# Patient Record
Sex: Female | Born: 1940 | Race: White | Hispanic: No | Marital: Single
Health system: Midwestern US, Community
[De-identification: ages and names within clinical notes are randomized; demographics above are authoritative.]

## PROBLEM LIST (undated history)

## (undated) DIAGNOSIS — M1712 Unilateral primary osteoarthritis, left knee: Secondary | ICD-10-CM

## (undated) DIAGNOSIS — M1990 Unspecified osteoarthritis, unspecified site: Secondary | ICD-10-CM

## (undated) DIAGNOSIS — F32A Depression, unspecified: Secondary | ICD-10-CM

## (undated) DIAGNOSIS — R112 Nausea with vomiting, unspecified: Secondary | ICD-10-CM

## (undated) DIAGNOSIS — Z9889 Other specified postprocedural states: Secondary | ICD-10-CM

## (undated) DIAGNOSIS — R011 Cardiac murmur, unspecified: Secondary | ICD-10-CM

## (undated) DIAGNOSIS — F419 Anxiety disorder, unspecified: Secondary | ICD-10-CM

## (undated) DIAGNOSIS — J189 Pneumonia, unspecified organism: Secondary | ICD-10-CM

## (undated) DIAGNOSIS — I10 Essential (primary) hypertension: Secondary | ICD-10-CM

## (undated) DIAGNOSIS — I499 Cardiac arrhythmia, unspecified: Secondary | ICD-10-CM

## (undated) DIAGNOSIS — N189 Chronic kidney disease, unspecified: Secondary | ICD-10-CM

## (undated) DIAGNOSIS — E119 Type 2 diabetes mellitus without complications: Secondary | ICD-10-CM

## (undated) HISTORY — PX: OTHER SURGICAL HISTORY: SHX169

## (undated) HISTORY — PX: JOINT REPLACEMENT: SHX530

## (undated) HISTORY — PX: CHOLECYSTECTOMY: SHX55

## (undated) HISTORY — PX: BACK SURGERY: SHX140

---

## 2020-06-11 ENCOUNTER — Inpatient Hospital Stay: Admit: 2020-06-11 | Payer: MEDICARE | Primary: Orthopaedic Surgery

## 2020-06-11 ENCOUNTER — Encounter

## 2020-06-11 DIAGNOSIS — M1712 Unilateral primary osteoarthritis, left knee: Secondary | ICD-10-CM

## 2020-06-11 LAB — COMPREHENSIVE METABOLIC PANEL
ALT: 25 U/L (ref 13–56)
AST: 18 U/L (ref 10–38)
Albumin/Globulin Ratio: 1 (ref 0.8–1.7)
Albumin: 3.7 g/dL (ref 3.4–5.0)
Alkaline Phosphatase: 94 U/L (ref 45–117)
Anion Gap: 6 mmol/L (ref 3.0–18)
BUN: 18 MG/DL (ref 7.0–18)
Bun/Cre Ratio: 13 (ref 12–20)
CO2: 30 mmol/L (ref 21–32)
Calcium: 9.2 MG/DL (ref 8.5–10.1)
Chloride: 106 mmol/L (ref 100–111)
Creatinine: 1.34 MG/DL — ABNORMAL HIGH (ref 0.6–1.3)
EGFR IF NonAfrican American: 38 mL/min/{1.73_m2} — ABNORMAL LOW (ref >60)
GFR African American: 46 mL/min/{1.73_m2} — ABNORMAL LOW (ref >60)
Globulin: 3.6 g/dL (ref 2.0–4.0)
Glucose: 114 mg/dL — ABNORMAL HIGH (ref 74–99)
Potassium: 4.3 mmol/L (ref 3.5–5.5)
Sodium: 142 mmol/L (ref 136–145)
Total Bilirubin: 0.3 MG/DL (ref 0.2–1.0)
Total Protein: 7.3 g/dL (ref 6.4–8.2)

## 2020-06-11 LAB — CBC
Hematocrit: 37.2 % (ref 35.0–45.0)
Hemoglobin: 11.1 g/dL — ABNORMAL LOW (ref 12.0–16.0)
MCH: 26.3 PG (ref 24.0–34.0)
MCHC: 29.8 g/dL — ABNORMAL LOW (ref 31.0–37.0)
MCV: 88.2 FL (ref 78.0–100.0)
MPV: 10.7 FL (ref 9.2–11.8)
NRBC Absolute: 0 10*3/uL (ref 0.00–0.01)
Nucleated RBCs: 0 PER 100 WBC
Platelets: 277 10*3/uL (ref 135–420)
RBC: 4.22 M/uL (ref 4.20–5.30)
RDW: 15 % — ABNORMAL HIGH (ref 11.6–14.5)
WBC: 5.3 10*3/uL (ref 4.6–13.2)

## 2020-06-11 LAB — URINALYSIS WITH MICROSCOPIC
Bilirubin, Urine: NEGATIVE
Glucose, Ur: NEGATIVE mg/dL
Hyaline Casts, UA: 1 /lpf (ref 0–2)
Nitrite, Urine: POSITIVE — AB
Protein, UA: NEGATIVE mg/dL
RBC, UA: 1 /hpf (ref 0–5)
Specific Gravity, UA: 1.022 (ref 1.005–1.030)
Urobilinogen, UA, POCT: 1 EU/dL (ref 0.2–1.0)
WBC, UA: 61 /hpf (ref 0–5)
pH, UA: 5 (ref 5.0–8.0)

## 2020-06-11 LAB — METABOLIC PANEL, COMPREHENSIVE
A-G Ratio: 1 (ref 0.8–1.7)
ALT (SGPT): 25 U/L (ref 13–56)
AST (SGOT): 18 U/L (ref 10–38)
Albumin: 3.7 g/dL (ref 3.4–5.0)
Alk. phosphatase: 94 U/L (ref 45–117)
Anion gap: 6 mmol/L (ref 3.0–18)
BUN/Creatinine ratio: 13 (ref 12–20)
BUN: 18 MG/DL (ref 7.0–18)
Bilirubin, total: 0.3 MG/DL (ref 0.2–1.0)
CO2: 30 mmol/L (ref 21–32)
Calcium: 9.2 MG/DL (ref 8.5–10.1)
Chloride: 106 mmol/L (ref 100–111)
Creatinine: 1.34 MG/DL — ABNORMAL HIGH (ref 0.6–1.3)
GFR est AA: 46 mL/min/{1.73_m2} — ABNORMAL LOW (ref >60)
GFR est non-AA: 38 mL/min/{1.73_m2} — ABNORMAL LOW (ref >60)
Globulin: 3.6 g/dL (ref 2.0–4.0)
Glucose: 114 mg/dL — ABNORMAL HIGH (ref 74–99)
Potassium: 4.3 mmol/L (ref 3.5–5.5)
Protein, total: 7.3 g/dL (ref 6.4–8.2)
Sodium: 142 mmol/L (ref 136–145)

## 2020-06-11 LAB — URINALYSIS W/MICROSCOPIC
Bilirubin: NEGATIVE
Glucose: NEGATIVE mg/dL
Hyaline cast: 1 /lpf (ref 0–2)
Nitrites: POSITIVE — AB
Protein: NEGATIVE mg/dL
RBC: 1 /hpf (ref 0–5)
Specific gravity: 1.022 (ref 1.005–1.030)
Urobilinogen: 1 EU/dL (ref 0.2–1.0)
WBC: 61 /hpf (ref 0–5)
pH (UA): 5 (ref 5.0–8.0)

## 2020-06-11 LAB — CBC W/O DIFF
ABSOLUTE NRBC: 0 10*3/uL (ref 0.00–0.01)
HCT: 37.2 % (ref 35.0–45.0)
HGB: 11.1 g/dL — ABNORMAL LOW (ref 12.0–16.0)
MCH: 26.3 PG (ref 24.0–34.0)
MCHC: 29.8 g/dL — ABNORMAL LOW (ref 31.0–37.0)
MCV: 88.2 FL (ref 78.0–100.0)
MPV: 10.7 FL (ref 9.2–11.8)
NRBC: 0 PER 100 WBC
PLATELET: 277 10*3/uL (ref 135–420)
RBC: 4.22 M/uL (ref 4.20–5.30)
RDW: 15 % — ABNORMAL HIGH (ref 11.6–14.5)
WBC: 5.3 10*3/uL (ref 4.6–13.2)

## 2020-06-12 LAB — EKG 12-LEAD
Atrial Rate: 55 {beats}/min
P Axis: 63 degrees
P-R Interval: 248 ms
Q-T Interval: 452 ms
QRS Duration: 82 ms
QTc Calculation (Bazett): 432 ms
R Axis: 80 degrees
T Axis: 75 degrees
Ventricular Rate: 55 {beats}/min

## 2020-06-12 LAB — EKG, 12 LEAD, INITIAL
Atrial Rate: 55 {beats}/min
Calculated P Axis: 63 degrees
Calculated R Axis: 80 degrees
Calculated T Axis: 75 degrees
P-R Interval: 248 ms
Q-T Interval: 452 ms
QRS Duration: 82 ms
QTC Calculation (Bezet): 432 ms
Ventricular Rate: 55 {beats}/min

## 2020-06-13 LAB — CULTURE, MRSA

## 2020-06-13 LAB — HEMOGLOBIN A1C W/EAG
Hemoglobin A1C: 6.4 % — ABNORMAL HIGH (ref 4.2–5.6)
eAG: 137 mg/dL

## 2020-06-13 LAB — HEMOGLOBIN A1C WITH EAG
Est. average glucose: 137 mg/dL
Hemoglobin A1c: 6.4 % — ABNORMAL HIGH (ref 4.2–5.6)

## 2020-06-14 LAB — CULTURE, URINE
Colonies Counted: >100000
Colony Count: >100000

## 2020-06-19 NOTE — Interval H&P Note (Addendum)
 [facility]  Notes by Arloa Fleeting, RN at 06/19/20 1530                Author: Arloa Fleeting, RN  Service: --  Author Type: Registered Nurse       Filed: 06/19/20 0945  Date of Service: 06/19/20 1530  Status: Addendum          Editor: Arloa Fleeting, RN (Registered Nurse)          Related Notes: Original Note by Arloa Fleeting, RN (Registered Nurse) filed at 06/19/20 [phone]               PAT - SURGICAL PRE-ADMISSION INSTRUCTIONS      NAME:  Anita Sanchez                                                           TODAY'S DATE:  06/19/2020      SURGERY DATE:  07/08/2020                                  SURGERY  ARRIVAL TIME:   tbd         1.  Do NOT eat or drink anything,  including candy or gum, after MIDNIGHT  on 07-08-20 , unless you have specific instructions from your Surgeon or Anesthesia Provider to do so.   2.  No smoking 24 hours before surgery.   3.  No alcohol 24 hours prior to the day of surgery.   4.  No recreational drugs for one week prior to the day of surgery.   5.  Leave all valuables, including money/purse, at home.   6.  Remove all jewelry, nail polish, makeup (including mascara); no lotions, powders, deodorant, or perfume/cologne/after shave.   7.  Glasses/Contact lenses and Dentures may be worn to the hospital.  They will be removed prior to surgery.   8.  Call your doctor if symptoms of a cold or illness develop within 24 ours prior to surgery.   9.  AN ADULT MUST DRIVE YOU HOME AFTER OUTPATIENT SURGERY.    10.  If you are having an OUTPATIENT procedure, please make arrangements for a responsible adult to be with you for 24 hours after your surgery.   11.  If you are admitted to the hospital, you will be assigned to a bed after surgery is complete.  Normally a family member will not be able to  see you until you are in your assigned bed.   12. Visitation Restrictions Explained.      Patient does not have a DNR order.   Urine culture was positive for E. Coli. Labs faxed to Dr. Thelbert. LVM for [facility].       Special Instructions:   Covid Test :Pt is vaccinated. Vaccination record in media. Quarantine requirements discussed   Take these medications the morning of surgery with a sip of water:  flecainide, metoprolol, sertraline, HOLD metformin/glucophage dose starting the EVENING BEFORE the day of surgery.     Stop all supplements 2 weeks prior to surgery   No NSAIDs 7 days prior to surgery   Follow-up with prescriber for instructions on when to stop eliquis and aspirin.      Patient Prep:  use CHG solution, Instructions provided.       These surgical instructions were reviewed with patient during the PAT phone call. Pt verbalized understanding of instructions provided.      Preoperative Nutrition Screen (PONS)    Patient's Age: 80 y.o.      Patient's BMI: Estimated body mass index is 35.25 kg/m as calculated from the following:     Height as of this encounter: 5' 3 (1.6 m).     Weight as of this encounter: 90.3 kg (199 lb).       If the answer to any of the following is Yes, then recommend prescribe Oral Nutrition Supplements (ONS) for at least 7 days prior to surgery and/or order referral to dietitian for further assessment and nutrition therapy.         1. Does the patient have a documented serum albumin less than 3.0 within the last 90 days?    No = 0                  2. Is patient's BMI less than 18.5 (or less than 20 if age over 59)?   No = 0            3. Has the patient had an unplanned weight loss of 10% of body weight or more in the last 6 months?  No = 0     4. Has the patient been eating less than 50% of their normal diet in the preceding week?  No = 0        PONS Score (number of Yes responses), 0-4  0

## 2020-06-20 ENCOUNTER — Inpatient Hospital Stay: Payer: MEDICARE | Primary: Orthopaedic Surgery

## 2020-07-03 NOTE — H&P (Signed)
H&P  by Truett Perna, PA-C at 07/03/20 [phone]                Author: Coralee Pesa Lenox Ponds, PA-C  Service: Physician Assistant  Author Type: Physician Assistant       Filed: 07/03/20 0738  Date of Service: 07/03/20 0736  Status: Signed           Editor: Hawraa Stambaugh, Talmadge Coventry (Physician Assistant)  Cosigner: Christeen Douglas, MD at 07/08/20 [phone]                                   History and Physical            Patient: Anita Sanchez               Sex: female          DOA: (Not on file)            Date of Birth:  04-Nov-1940      Age:  80 y.o.        LOS:  LOS: 0 days            HPI:        Leva is an 80 year old, right-handed white female referred here for evaluation and treatment of a left severe medial tibiofemoral DJD. Her daughter is Lili Harts, who is a local of PT aide and sees a lot of patients postoperatively in their homes. She  is here because of her daughter's experience. She has already had cortisone injections, viscosupplementation, etc., and has continued pain that is unresponsive to these interventions.      Standing AP, tunnel, lateral, and sunrise views of the left knee were obtained and interpreted in the office and show severe medial tibiofemoral degenerative joint disease.     Past Medical History:        Diagnosis  Date         ?  Arrhythmia            afib- had an ablation in 2016         ?  Arthritis            joints, knees, hips back         ?  Chronic kidney disease            stage 3         ?  Chronic pain       ?  Diabetes (HCC)       ?  Hypercholesteremia       ?  Hypertension       ?  Psychiatric disorder            anxiety         ?  Syncope and collapse  04/2019             Past Surgical History:         Procedure  Laterality  Date          ?  HX BACK SURGERY              lumbar          ?  HX CHOLECYSTECTOMY         ?  HX COLONOSCOPY         ?  HX HEENT  Bilateral       ?  HX HYSTERECTOMY         ?  PR PARTIAL HIP REPLACEMENT  Right       ?  PR TOTAL HIP  ARTHROPLASTY  Right            x3           No family history on file.        Social History          Socioeconomic History         ?  Marital status:  SINGLE       Tobacco Use         ?  Smoking status:  Never Smoker     ?  Smokeless tobacco:  Never Used       Vaping Use         ?  Vaping Use:  Never used       Substance and Sexual Activity         ?  Alcohol use:  Not Currently         ?  Drug use:  Never             Prior to Admission medications             Medication  Sig  Start Date  End Date  Taking?  Authorizing Provider            acetaminophen (TYLENOL) 500 mg tablet  Take 500 mg by mouth every four (4) hours as needed.        Provider, Historical     apixaban (ELIQUIS) 5 mg tablet  Take 5 mg by mouth two (2) times a day.  04/15/20      Provider, Historical     aspirin delayed-release 81 mg tablet  Take 81 mg by mouth daily.        Provider, Historical     atorvastatin (LIPITOR) 40 mg tablet  Take 1 Tablet by mouth nightly.  05/18/20      Provider, Historical     cholecalciferol (VITAMIN D3) 25 mcg (1,000 unit) cap  Take 1,000 Units by mouth daily.        Provider, Historical     flecainide (TAMBOCOR) 50 mg tablet  Take 50 mg by mouth two (2) times a day.        Provider, Historical     gabapentin (NEURONTIN) 300 mg capsule  Take 300 mg by mouth nightly.        Provider, Historical     diphenhydrAMINE-acetaminophen 25-500 mg tab  Take 1 Tablet by mouth nightly as needed.        Provider, Historical     metFORMIN (GLUCOPHAGE) 500 mg tablet  Take 1 Tablet by mouth nightly.  08/22/19      Provider, Historical     metoprolol succinate (TOPROL-XL) 50 mg XL tablet  Take 50 mg by mouth daily.  05/18/20      Provider, Historical            sertraline (ZOLOFT) 50 mg tablet  Take 1 Tablet by mouth daily.  02/17/20      Provider, Historical             Allergies        Allergen  Reactions         ?  Sulfa (Sulfonamide Antibiotics)  Rash         ?  Morphine  Itching           Review of Systems   GENERAL:  Patient has no signs  of fever, chills or weight change.   HEAD/ENTM:  Patient has no signs of headaches, dizziness, hearing loss, ringing in ears, sore throat/hoarseness, recent cold, double vision, blurred vision, itchy eyes, eye redness or eye discharge.   NEUROLOGIC:  Patient has no signs of fainting, muscle weakness, numbness/tingling, loss of balance or seizure disorder.   CARDIOVASCULAR:  Patient has no signs of chest pain, palpitations, rheumatic fever or heart murmur.   RESPIRATORY:  Patient has no signs of chronic cough, wheezing, difficulty breathing, pain on breathing or shortness of breath.   GASTROINTESTINAL:  Patient has no signs of nausea/vomiting, difficulty swallowing, gas/bloating, indigestion, abdominal pain, diarrhea, bloody stools or hemorrhoids.   GENITOURINARY:  Patient has no signs of blood in urine, painful urinating, burning sensation, bladder/kidney infection, frequent urinating or incontinence.   MUSCULOSKELETAL: Patient presents with joint pain. Patient has no signs of fracture/dislocation, sprain/strain, tendonitis, joint stiffness, rheumatoid disease, gout or swelling of feet.   INTEGUMENTARY:  Patient has no signs of rash/itching, psoriasis, Raynaud's phenomenon or varicose veins.   EMOTIONAL: Patient presents with anxiety and depression. Patient has no signs of bipolar disorder, memory loss or change in mood.           Physical Exam:         There were no vitals taken for this visit.      Physical Exam:    Physical exam shows a healthy-appearing 80 year old white female. The left knee has pain on the medial joint line with positive patellofemoral crepitation and positive patellar inhibition. Otherwise, examination of the left knee demonstrates  the patient has full extension to flexion well beyond 90 degrees.  The patient has a negative Lachman and has no varus or valgus instability at zero degrees or 30 degrees.  There is a negative posterior sag.  There is no knee effusion.  There is no swelling,   ecchymosis, or wounds.  The patient has no lateral joint line pain and no popliteal pain.  The patient has a negative patellar grind and a negative patellar apprehension test.  There is a negative Barista.  There is no pain at the inferior pole  of the patella or the tibial tubercle.  The patient has 5/5 muscle strength with good quadriceps tone.  The patient has good capillary refill with normal motor strength of the foot and ankle and normal light-touch sensation in the foot and lower leg.           Assessment/Plan        Principal Problem:     Primary osteoarthritis of left knee (07/03/2020)      Active Problems:     Anxiety (07/03/2020)        A-fib (HCC) (07/03/2020)        Diabetes mellitus type 2, controlled (HCC) (07/03/2020)        HTN (hypertension) (07/03/2020)         The risks associated with left outpatient total knee arthroplasty using the subvastus approach were reviewed and all questions were answered.  The benefits include earlier ambulation, better mobility, and quicker return of muscle function.  The risks  including pain, bleeding, infection, DVT, need for future surgeries amongst others are reviewed and questions are answered.  The patient is going to be scheduled for outpatient TKA using the DePuy Attune knee replacement system.  We have given the patient  Keflex for antibiotic prophylaxis and also a prescription for Roxicodone for postoperative pain management.  We will use aspirin 81mg  twice  daily for DVT prophylaxis.  The patient will also receive an IV dose of antibiotics preoperatively.  The patient  will be discharged the same day to home health physical therapy.  The patient was counseled on the importance of ice, elevation, muscle stretching and range of motion exercises.  The patient will follow up two weeks postoperatively.  Review of x-rays  show Kellgren-Lawrence grade 3/4 changes. She does not require a walker today as she has one at home. She is on Eliquis and will discuss this  with her cardiologist about how to handle this perioperatively. She takes this with one baby aspirin twice a  day normally.

## 2020-07-03 NOTE — Care Coordination-Inpatient (Signed)
Anita Sanchez watched the joint seminar online and received a preoperative education booklet in anticipation of joint replacement surgery    Nurse Navigator

## 2020-07-08 ENCOUNTER — Inpatient Hospital Stay: Payer: MEDICARE

## 2020-08-04 ENCOUNTER — Ambulatory Visit (INDEPENDENT_AMBULATORY_CARE_PROVIDER_SITE_OTHER): Payer: Medicare Other

## 2020-08-04 ENCOUNTER — Ambulatory Visit (INDEPENDENT_AMBULATORY_CARE_PROVIDER_SITE_OTHER): Payer: Medicare Other | Admitting: Orthopaedic Surgery

## 2020-08-04 VITALS — Ht 63.0 in | Wt 199.0 lb

## 2020-08-04 DIAGNOSIS — M1712 Unilateral primary osteoarthritis, left knee: Secondary | ICD-10-CM

## 2020-08-04 DIAGNOSIS — G8929 Other chronic pain: Secondary | ICD-10-CM | POA: Diagnosis not present

## 2020-08-04 DIAGNOSIS — M25562 Pain in left knee: Secondary | ICD-10-CM

## 2020-08-04 NOTE — Progress Notes (Signed)
Office Visit Note   Patient: Katie Zuniga           Date of Birth: 1941-03-27           MRN: [id number] Visit Date: 08/04/2020              Requested by: No referring provider defined for this encounter. PCP: No primary care provider on file.   Assessment & Plan: Visit Diagnoses:  1. Chronic pain of left knee   2. Unilateral primary osteoarthritis, left knee     Plan: Given the severity of her left knee pain combined with the arthritic findings on x-ray and the failure of multiple rounds of conservative treatment, we are recommending knee replacement surgery for left knee and she is requested this as well.  I did show her knee model and went over her x-rays.  We talked about knee replacement surgery in detail.  I would have her stop her Eliquis 4 days before surgery.  I described the interoperative and postoperative course and we talked in detail about the risks and benefits of the surgery.  All questions and concerns were answered and addressed.  She is interested in having this done in the near future.  Follow-Up Instructions: Return for 2 weeks post-op.   Orders:  Orders Placed This Encounter  Procedures  . XR Knee 1-2 Views Left   No orders of the defined types were placed in this encounter.     Procedures: No procedures performed   Clinical Data: No additional findings.   Subjective: Chief Complaint  Patient presents with  . Left Knee - Pain  The patient is a 80 year old female that I am seeing for the first time.  She actually has an orthopedic surgeon in [place] who is since left town.  She comes in with a chief complaint of chronic left knee pain.  It has been hurting for many years off-and-on but her pain has been worsening with activities for many years now and is getting worse.  At this point her left knee pain is daily and it is 10 out of 10.  It is detrimentally affecting her mobility, her quality of life and actives daily living.  She cannot take  anti-inflammatories since she is on Eliquis.  This is for A. fib.  She is a diabetic but has a hemoglobin A1c of well below 7.  She does use a cane to mobilize.  She has had numerous steroid injections and hyaluronic acid injections in the left knee over the years.  She has tried quad training activities and exercises as well as modified her activities in general.  At this point she is ready to proceed with knee replacement surgery.  She has had conservative treatment for well over 12 months now with the failure of this due to her left knee pain.  It does wake her up at night.  She has slowed down significantly as a result of her left knee hurting her.  HPI  Review of Systems She currently denies any headache, chest pain, shortness of breath, fever, chills, nausea, vomiting  Objective: Vital Signs: Ht 5\' 3"  (1.6 m)   Wt 199 lb (90.3 kg)   BMI 35.25 kg/m   Physical Exam She is alert and orient x3 and in no acute distress Ortho Exam Examination of her left painful knee shows medial joint line tenderness and lateral tenderness.  There is pain throughout the flexion extension arc.  The knee is ligamentously stable but does have varus  malalignment.  She has a positive Murray sign to the medial compartment.  There is also mild effusion with the right knee. Specialty Comments:  No specialty comments available.  Imaging: XR Knee 1-2 Views Left  Result Date: 08/04/2020 2 views the left knee show tricompartment arthritic changes.  There is varus malalignment.  There are para-articular osteophytes in all 3 compartments.  There is medial joint space narrowing and calcifications around both medial and lateral meniscus.    PMFS History: Patient Active Problem List   Diagnosis Date Noted  . Unilateral primary osteoarthritis, left knee 08/04/2020   No past medical history on file.  No family history on file.   Social History   Occupational History  . Not on file  Tobacco Use  . Smoking  status: Not on file  . Smokeless tobacco: Not on file  Substance and Sexual Activity  . Alcohol use: Not on file  . Drug use: Not on file  . Sexual activity: Not on file

## 2020-08-04 NOTE — Progress Notes (Deleted)
Office Visit Note   Patient: Katie Zuniga           Date of Birth: [birth year]           MRN: [id number] Visit Date: 08/04/2020              Requested by: No referring provider defined for this encounter. PCP: No primary care provider on file.   Assessment & Plan: Visit Diagnoses:  1. Chronic pain of left knee     Plan: ***  Follow-Up Instructions: No follow-ups on file.   Orders:  Orders Placed This Encounter  Procedures  . XR Knee 1-2 Views Left   No orders of the defined types were placed in this encounter.     Procedures: No procedures performed   Clinical Data: No additional findings.   Subjective: Chief Complaint  Patient presents with  . Left Knee - Pain  HPI  Review of Systems   Objective: Vital Signs: Ht 5\' 3"  (1.6 m)   Wt 199 lb (90.3 kg)   BMI 35.25 kg/m   Physical Exam  Ortho Exam  Specialty Comments:  No specialty comments available.  Imaging: No results found.   PMFS  History: There are no problems to display for this patient.  No past medical history on file.  No family history on file.  *** The histories are not reviewed yet. Please review them in the "History" navigator section and refresh this SmartLink. Social History   Occupational History  . Not on file  Tobacco Use  . Smoking status: Not on file  . Smokeless tobacco: Not on file  Substance and Sexual Activity  . Alcohol use: Not on file  . Drug use: Not on file  . Sexual activity: Not on file

## 2020-08-25 NOTE — Progress Notes (Signed)
Need orders in epic.  Srugeryon 5/27.  preop on 5/19.

## 2020-08-26 ENCOUNTER — Other Ambulatory Visit: Payer: Self-pay | Admitting: Physician Assistant

## 2020-08-27 ENCOUNTER — Other Ambulatory Visit: Payer: Self-pay

## 2020-08-27 ENCOUNTER — Encounter (HOSPITAL_COMMUNITY)
Admission: RE | Admit: 2020-08-27 | Discharge: 2020-08-27 | Disposition: A | Discharge status: Home / Self Care | Payer: Medicare Other | Source: Ambulatory Visit | Attending: Orthopaedic Surgery | Admitting: Orthopaedic Surgery

## 2020-08-27 ENCOUNTER — Encounter (HOSPITAL_COMMUNITY): Payer: Self-pay

## 2020-08-27 DIAGNOSIS — I129 Hypertensive chronic kidney disease with stage 1 through stage 4 chronic kidney disease, or unspecified chronic kidney disease: Secondary | ICD-10-CM | POA: Insufficient documentation

## 2020-08-27 DIAGNOSIS — Z7984 Long term (current) use of oral hypoglycemic drugs: Secondary | ICD-10-CM | POA: Insufficient documentation

## 2020-08-27 DIAGNOSIS — M1712 Unilateral primary osteoarthritis, left knee: Secondary | ICD-10-CM | POA: Insufficient documentation

## 2020-08-27 DIAGNOSIS — Z79899 Other long term (current) drug therapy: Secondary | ICD-10-CM | POA: Insufficient documentation

## 2020-08-27 DIAGNOSIS — N183 Chronic kidney disease, stage 3 unspecified: Secondary | ICD-10-CM | POA: Insufficient documentation

## 2020-08-27 DIAGNOSIS — Z01812 Encounter for preprocedural laboratory examination: Secondary | ICD-10-CM | POA: Diagnosis not present

## 2020-08-27 DIAGNOSIS — Z7901 Long term (current) use of anticoagulants: Secondary | ICD-10-CM | POA: Insufficient documentation

## 2020-08-27 DIAGNOSIS — I4891 Unspecified atrial fibrillation: Secondary | ICD-10-CM | POA: Diagnosis not present

## 2020-08-27 DIAGNOSIS — E1122 Type 2 diabetes mellitus with diabetic chronic kidney disease: Secondary | ICD-10-CM | POA: Diagnosis not present

## 2020-08-27 DIAGNOSIS — Z7982 Long term (current) use of aspirin: Secondary | ICD-10-CM | POA: Insufficient documentation

## 2020-08-27 HISTORY — DX: Other specified postprocedural states: Z98.890

## 2020-08-27 HISTORY — DX: Cardiac murmur, unspecified: R01.1

## 2020-08-27 HISTORY — DX: Type 2 diabetes mellitus without complications: E11.9

## 2020-08-27 HISTORY — DX: Depression, unspecified: F32.A

## 2020-08-27 HISTORY — DX: Nausea with vomiting, unspecified: R11.2

## 2020-08-27 HISTORY — DX: Cardiac arrhythmia, unspecified: I49.9

## 2020-08-27 HISTORY — DX: Essential (primary) hypertension: I10

## 2020-08-27 HISTORY — DX: Unspecified osteoarthritis, unspecified site: M19.90

## 2020-08-27 HISTORY — DX: Anxiety disorder, unspecified: F41.9

## 2020-08-27 HISTORY — DX: Chronic kidney disease, unspecified: N18.9

## 2020-08-27 HISTORY — DX: Pneumonia, unspecified organism: J18.9

## 2020-08-27 LAB — BASIC METABOLIC PANEL
Anion gap: 11 (ref 5–15)
BUN: 16 mg/dL (ref 8–23)
CO2: 26 mmol/L (ref 22–32)
Calcium: 8.9 mg/dL (ref 8.9–10.3)
Chloride: 103 mmol/L (ref 98–111)
Creatinine, Ser: 1.09 mg/dL — ABNORMAL HIGH (ref 0.44–1.00)
GFR, Estimated: 52 mL/min — ABNORMAL LOW (ref >60)
Glucose, Bld: 104 mg/dL — ABNORMAL HIGH (ref 70–99)
Potassium: 4.4 mmol/L (ref 3.5–5.1)
Sodium: 140 mmol/L (ref 135–145)

## 2020-08-27 LAB — CBC
HCT: 35 % — ABNORMAL LOW (ref 36.0–46.0)
Hemoglobin: 10.7 g/dL — ABNORMAL LOW (ref 12.0–15.0)
MCH: 27.4 pg (ref 26.0–34.0)
MCHC: 30.6 g/dL (ref 30.0–36.0)
MCV: 89.7 fL (ref 80.0–100.0)
Platelets: 195 10*3/uL (ref 150–400)
RBC: 3.9 MIL/uL (ref 3.87–5.11)
RDW: 15.4 % (ref 11.5–15.5)
WBC: 4.7 10*3/uL (ref 4.0–10.5)
nRBC: 0 % (ref 0.0–0.2)

## 2020-08-27 LAB — GLUCOSE, CAPILLARY: Glucose-Capillary: 97 mg/dL (ref 70–99)

## 2020-08-27 LAB — HEMOGLOBIN A1C
Hgb A1c MFr Bld: 6.2 % — ABNORMAL HIGH (ref 4.8–5.6)
Mean Plasma Glucose: 131.24 mg/dL

## 2020-08-27 LAB — SURGICAL PCR SCREEN
MRSA, PCR: NEGATIVE
Staphylococcus aureus: NEGATIVE

## 2020-08-27 NOTE — Progress Notes (Addendum)
Anesthesia Review:  PCP: [facility]  DR Luetta Nutting  Cardiologist :DR Isabel Caprice  LOV 06/01/20 Care Everywhere  Clearance on chart dated 08/04/2020.  Have requested LOV note , ekg and etc from Cardiology by fax.  Confirmation received.  REquested above information.  Chest x-ray : 07/21/20 on chart  EKG : 07/04/20 on chartEcho : Stress test: Cardiac Cath :  Activity level: can do flight of stairs slowly per pt  Sleep Study/ CPAP :none  Fasting Blood Sugar :      / Checks Blood Sugar -- times a day:   Blood Thinner/ Instructions /Last Dose: ASA / Instructions/ Last Dose :  Checks glucose once daily at home per pt  Eliquis- Stop 5 days prior per pt  Pt was in hospital at [facility] 06/2020 with pneumonia.  Have requested by fax 12 lead ekg tracing Ct angio pulm result and discharge summary as well and CXR results of 07/21/2020 from this hospitalization.  - on chart  CBC done 08/27/2020 rotued to Dr Allie Bossier.  hgba1c-08/27/20- 6.2 Discharge summary 07/04/20 on chart

## 2020-08-27 NOTE — Progress Notes (Signed)
DUE TO COVID-19 ONLY ONE VISITOR IS ALLOWED TO COME WITH YOU AND STAY IN THE WAITING ROOM ONLY DURING PRE OP AND PROCEDURE DAY OF SURGERY. THE 1 VISITOR  MAY VISIT WITH YOU AFTER SURGERY IN YOUR PRIVATE ROOM DURING VISITING HOURS ONLY!  YOU NEED TO HAVE A COVID 19 TEST ON__5/24/22 _____ @_______ , THIS TEST MUST BE DONE BEFORE SURGERY,  COVID TESTING SITE [address] [place] , IT IS ON THE RIGHT GOING OUT [address] APPROXIMATELY  2 MINUTES PAST [employer] ON THE RIGHT. ONCE YOUR COVID TEST IS COMPLETED,  PLEASE BEGIN THE QUARANTINE INSTRUCTIONS AS OUTLINED IN YOUR HANDOUT.                Katie Zuniga  08/27/2020   Your procedure is scheduled on:  09/04/20  Report to [facility] Main  Entrance   Report to admitting at   0830 AM     Call this number if you have problems the morning of surgery [phone]    REMEMBER: NO  SOLID FOOD CANDY OR GUM AFTER MIDNIGHT. CLEAR LIQUIDS UNTIL 0800am       . NOTHING BY MOUTH EXCEPT CLEAR LIQUIDS UNTIL     0800am . PLEASE FINISH ENSURE DRINK PER SURGEON ORDER  WHICH NEEDS TO BE COMPLETED AT   0800am    .      CLEAR LIQUID DIET   Foods Allowed                                                                    Coffee and tea, regular and decaf                            Fruit ices (not with fruit pulp)                                      Iced Popsicles                                    Carbonated beverages, regular and diet                                    Cranberry, grape and apple juices Sports drinks like Gatorade Lightly seasoned clear broth or consume(fat free) Sugar, honey syrup ___________________________________________________________________      BRUSH YOUR TEETH MORNING OF SURGERY AND RINSE YOUR MOUTH OUT, NO CHEWING GUM CANDY OR MINTS.     Take these medicines the morning of surgery with A SIP OF WATER:    Zoloft, torpol, flecainide DO NOT TAKE ANY DIABETIC MEDICATIONS DAY OF YOUR  SURGERY                               You may not have any metal on your body including hair pins and              piercings  Do not  wear jewelry, make-up, lotions, powders or perfumes, deodorant             Do not wear nail polish on your fingernails.  Do not shave  48 hours prior to surgery.              Men may shave face and neck.   Do not bring valuables to the hospital. [place].  Contacts, dentures or bridgework may not be worn into surgery.  Leave suitcase in the car. After surgery it may be brought to your room.     Patients discharged the day of surgery will not be allowed to drive home. IF YOU ARE HAVING SURGERY AND GOING HOME THE SAME DAY, YOU MUST HAVE AN ADULT TO DRIVE YOU HOME AND BE WITH YOU FOR 24 HOURS. YOU MAY GO HOME BY TAXI OR UBER OR ORTHERWISE, BUT AN ADULT MUST ACCOMPANY YOU HOME AND STAY WITH YOU FOR 24 HOURS.  Name and phone number of your driver:  Special Instructions: N/A              Please read over the following fact sheets you were given: _____________________________________________________________________  [facility] - Preparing for Surgery Before surgery, you can play an important role.  Because skin is not sterile, your skin needs to be as free of germs as possible.  You can reduce the number of germs on your skin by washing with CHG (chlorahexidine gluconate) soap before surgery.  CHG is an antiseptic cleaner which kills germs and bonds with the skin to continue killing germs even after washing. Please DO NOT use if you have an allergy to CHG or antibacterial soaps.  If your skin becomes reddened/irritated stop using the CHG and inform your nurse when you arrive at Short Stay. Do not shave (including legs and underarms) for at least 48 hours prior to the first CHG shower.  You may shave your face/neck. Please follow these instructions carefully:  1.  Shower with CHG Soap the night before surgery and the   morning of Surgery.  2.  If you choose to wash your hair, wash your hair first as usual with your  normal  shampoo.  3.  After you shampoo, rinse your hair and body thoroughly to remove the  shampoo.                           4.  Use CHG as you would any other liquid soap.  You can apply chg directly  to the skin and wash                       Gently with a scrungie or clean washcloth.  5.  Apply the CHG Soap to your body ONLY FROM THE NECK DOWN.   Do not use on face/ open                           Wound or open sores. Avoid contact with eyes, ears mouth and genitals (private parts).                       Wash face,  Genitals (private parts) with your normal soap.             6.  Wash thoroughly, paying  special attention to the area where your surgery  will be performed.  7.  Thoroughly rinse your body with warm water from the neck down.  8.  DO NOT shower/wash with your normal soap after using and rinsing off  the CHG Soap.                9.  Pat yourself dry with a clean towel.            10.  Wear clean pajamas.            11.  Place clean sheets on your bed the night of your first shower and do not  sleep with pets. Day of Surgery : Do not apply any lotions/deodorants the morning of surgery.  Please wear clean clothes to the hospital/surgery center.  FAILURE TO FOLLOW THESE INSTRUCTIONS MAY RESULT IN THE CANCELLATION OF YOUR SURGERY PATIENT SIGNATURE_________________________________  NURSE SIGNATURE__________________________________  ________________________________________________________________________

## 2020-08-31 ENCOUNTER — Other Ambulatory Visit: Payer: Self-pay

## 2020-08-31 NOTE — Progress Notes (Signed)
Anesthesia Chart Review:   Case: [id number] Date/Time: 09/04/20 1045   Procedure: LEFT TOTAL KNEE ARTHROPLASTY (Left Knee)   Anesthesia type: Spinal   Pre-op diagnosis: osteoarthritis left knee   Location: [facility] ROOM 10 / [facility]   Surgeons: [name], MD      DISCUSSION: Pt is 80 years old with hx atrial fibrillation (s/p ablation 2013 at [facility]), HTN, DM, CKD (stage 3).   Hospitalized 3/26-31/22  for acute hypoxemic respiratory failure due to LLL pneumonia  Pt reports she is stopping eliquis 5 days before surgery  VS: BP 133/63   Pulse (!) 58   Temp 36.8 C (Oral)   Resp 16   Ht 5\' 3"  (1.6 m)   Wt 88.9 kg   SpO2 99%   BMI 34.72 kg/m    PROVIDERS: - PCP is , MD at [facility]. Last office visit 08/24/20 (notes in care everywhere)  - Cardiologist is Jimmarck Cuenta, DO. Last office visit 06/01/20 (notes in care everywhere) . Dr. 06/03/20 cleared pt for surgery    LABS: Labs reviewed: Acceptable for surgery. (all labs ordered are listed, but only abnormal results are displayed)  Labs Reviewed  CBC - Abnormal; Notable for the following components:      Result Value   Hemoglobin 10.7 (*)    HCT 35.0 (*)    All other components within normal limits  BASIC METABOLIC PANEL - Abnormal; Notable for the following components:   Glucose, Bld 104 (*)    Creatinine, Ser 1.09 (*)    GFR, Estimated 52 (*)    All other components within normal limits  HEMOGLOBIN A1C - Abnormal; Notable for the following components:   Hgb A1c MFr Bld 6.2 (*)    All other components within normal limits  SURGICAL PCR SCREEN  GLUCOSE, CAPILLARY  TYPE AND SCREEN     IMAGES: CXR 07/21/20 (care everywhere): No active disease.    CT angio pulmonary 07/04/20 (care everywhere):   There are small pleural effusions with adjacent bibasilar atelectasis. Infiltrate is less likely.   No evidence of pulmonary embolism. Mildly limited evaluation of the  subsegmental arteries at the left posterior lung base due to the left basilar atelectasis.   There are some small lymph nodes in the AP window and precarinal region of the mediastinum. Although not significantly enlarged, they are slightly larger than they were previously.    EKG 07/04/20: - report in care everywhere states: Sinus rhythm with 1st degree AV block - tracing requested.    CV: Carotid duplex 04/17/19 (care everywhere):  1. No hemodynamically significant stenosis on either side.   2. Both vertebral arteries are patent with antegrade flow.  3. 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     Echo 04/16/19 (care everywhere):  - Left Ventricle: Systolic function is normal. EF: 60-65%. No regional  wall motion abnormalities noted. Unable to assess diastolic function due  to atrial fibrillation.  . Aortic Valve: Normal tricuspid aortic valve. The leaflets are mildly  calcified.  . Mitral Valve: There is trace regurgitation.  . Tricuspid Valve: Mild tricuspid valve regurgitation.  . Pulmonic Valve: Trace regurgitation.  . Pericardium: There is no pericardial effusion.   Past Medical History:  Diagnosis Date  . Anxiety   . Arthritis   . Chronic kidney disease    stage 3   . Depression   . Diabetes mellitus without complication (HCC)    type 2   . Dysrhythmia  afib  . Heart murmur   . Hypertension   . Pneumonia    hx of 06/2020 in hospital in [place]   . PONV (postoperative nausea and vomiting)    hx of years ago     MEDICATIONS: . acetaminophen (TYLENOL) 500 MG tablet  . aspirin 81 MG EC tablet  . atorvastatin (LIPITOR) 40 MG tablet  . Cholecalciferol 25 MCG (1000 UT) tablet  . ELIQUIS 5 MG TABS tablet  . flecainide (TAMBOCOR) 50 MG tablet  . gabapentin (NEURONTIN) 300 MG capsule  . hydroxypropyl methylcellulose / hypromellose (ISOPTO TEARS / GONIOVISC) 2.5 % ophthalmic solution  . Melatonin 10 MG TABS  . metFORMIN (GLUCOPHAGE) 500  MG tablet  . metoprolol succinate (TOPROL-XL) 50 MG 24 hr tablet  . sertraline (ZOLOFT) 100 MG tablet   No current facility-administered medications for this encounter.   Pt reports she is stopping eliquis 5 days before surgery   If no changes, I anticipate pt can proceed with surgery as scheduled.   Rica Mast, PhD, FNP-BC [facility]/Anesthesiology Phone: [phone] 08/31/2020 1:54 PM

## 2020-08-31 NOTE — Anesthesia Preprocedure Evaluation (Addendum)
Anesthesia Evaluation  Patient identified by MRN, date of birth, ID band Patient awake    Reviewed: Allergy & Precautions, NPO status , Patient's Chart, lab work & pertinent test results  History of Anesthesia Complications (+) PONV and history of anesthetic complications  Airway Mallampati: II  TM Distance: >3 FB Neck ROM: Full    Dental  (+) Edentulous Upper, Missing,    Pulmonary neg pulmonary ROS,    Pulmonary exam normal        Cardiovascular hypertension, Normal cardiovascular exam+ dysrhythmias Atrial Fibrillation   TTE 04/2019: normal EF, mild TR   Neuro/Psych Anxiety Depression negative neurological ROS     GI/Hepatic negative GI ROS, Neg liver ROS,   Endo/Other  diabetes, Type 2  Renal/GU Renal InsufficiencyRenal disease  negative genitourinary   Musculoskeletal  (+) Arthritis ,   Abdominal   Peds  Hematology negative hematology ROS (+)   Anesthesia Other Findings Day of surgery medications reviewed with patient.  Reproductive/Obstetrics negative OB ROS                           Anesthesia Physical Anesthesia Plan  ASA: III  Anesthesia Plan: Spinal   Post-op Pain Management:  Regional for Post-op pain   Induction:   PONV Risk Score and Plan: 4 or greater and Treatment may vary due to age or medical condition, Ondansetron, Propofol infusion and Dexamethasone  Airway Management Planned: Natural Airway and Simple Face Mask  Additional Equipment: None  Intra-op Plan:   Post-operative Plan:   Informed Consent: I have reviewed the patients History and Physical, chart, labs and discussed the procedure including the risks, benefits and alternatives for the proposed anesthesia with the patient or authorized representative who has indicated his/her understanding and acceptance.       Plan Discussed with: CRNA  Anesthesia Plan Comments: (See APP note by Joslyn Hy, FNP )       Anesthesia Quick Evaluation

## 2020-09-01 ENCOUNTER — Other Ambulatory Visit (HOSPITAL_COMMUNITY)
Admission: RE | Admit: 2020-09-01 | Discharge: 2020-09-01 | Disposition: A | Discharge status: Home / Self Care | Payer: Medicare Other | Source: Ambulatory Visit | Attending: Orthopaedic Surgery | Admitting: Orthopaedic Surgery

## 2020-09-01 DIAGNOSIS — Z20822 Contact with and (suspected) exposure to covid-19: Secondary | ICD-10-CM | POA: Insufficient documentation

## 2020-09-01 DIAGNOSIS — Z01812 Encounter for preprocedural laboratory examination: Secondary | ICD-10-CM | POA: Diagnosis present

## 2020-09-01 LAB — SARS CORONAVIRUS 2 (TAT 6-24 HRS): SARS Coronavirus 2: NEGATIVE

## 2020-09-01 NOTE — Progress Notes (Signed)
Have rqequested by fax x 2 to Cardiology office and requested LOV note, ekg, stress test aND echo if available.  Called office and spoke with Medical Records.  They are to fax.

## 2020-09-03 NOTE — H&P (Signed)
TOTAL KNEE ADMISSION H&P  Patient is being admitted for left total knee arthroplasty.  Subjective:  Chief Complaint:left knee pain.  HPI: Katie Zuniga, 80 y.o. female, has a history of pain and functional disability in the left knee due to arthritis and has failed non-surgical conservative treatments for greater than 12 weeks to includeNSAID's and/or analgesics, corticosteriod injections, viscosupplementation injections, flexibility and strengthening excercises, use of assistive devices and activity modification.  Onset of symptoms was gradual, starting 2 years ago with gradually worsening course since that time. The patient noted no past surgery on the left knee(s).  Patient currently rates pain in the left knee(s) at 10 out of 10 with activity. Patient has night pain, worsening of pain with activity and weight bearing, pain that interferes with activities of daily living, pain with passive range of motion, crepitus and joint swelling.  Patient has evidence of subchondral sclerosis, periarticular osteophytes and joint space narrowing by imaging studies. There is no active infection.  Patient Active Problem List   Diagnosis Date Noted  . Unilateral primary osteoarthritis, left knee 08/04/2020   Past Medical History:  Diagnosis Date  . Anxiety   . Arthritis   . Chronic kidney disease    stage 3   . Depression   . Diabetes mellitus without complication (HCC)    type 2   . Dysrhythmia    afib  . Heart murmur   . Hypertension   . Pneumonia    hx of 06/2020 in hospital in [place]   . PONV (postoperative nausea and vomiting)    hx of years ago       No current facility-administered medications for this encounter.   Current Outpatient Medications  Medication Sig Dispense Refill Last Dose  . acetaminophen (TYLENOL) 500 MG tablet Take 500 mg by mouth every 6 (six) hours as needed.     Marland Kitchen aspirin 81 MG EC tablet Take 81 mg by mouth daily.     Marland Kitchen atorvastatin (LIPITOR) 40 MG tablet Take 40  mg by mouth daily.     . Cholecalciferol 25 MCG (1000 UT) tablet Take 1,000 Units by mouth daily.     Marland Kitchen ELIQUIS 5 MG TABS tablet Take 5 mg by mouth 2 (two) times daily.     . flecainide (TAMBOCOR) 50 MG tablet Take 50 mg by mouth 2 (two) times daily.     Marland Kitchen gabapentin (NEURONTIN) 300 MG capsule Take 300 mg by mouth at bedtime.     . hydroxypropyl methylcellulose / hypromellose (ISOPTO TEARS / GONIOVISC) 2.5 % ophthalmic solution Place 2 drops into both eyes daily.     . Melatonin 10 MG TABS Take 10 mg by mouth at bedtime.     . metFORMIN (GLUCOPHAGE) 500 MG tablet Take 500 mg by mouth every evening.     . metoprolol succinate (TOPROL-XL) 50 MG 24 hr tablet Take 100 mg by mouth daily.     . sertraline (ZOLOFT) 100 MG tablet Take 100 mg by mouth daily.      Allergies  Allergen Reactions  . Morphine And Related Itching and Rash  . Sulfa Antibiotics Rash  . Tramadol Other (See Comments)    "kept me awake" "keeps awake at night"     Social History   Tobacco Use  . Smoking status: Never Smoker  . Smokeless tobacco: Not on file  Substance Use Topics  . Alcohol use: Never    No family history on file.   Review of Systems  Musculoskeletal: Positive for  joint swelling.  All other systems reviewed and are negative.   Objective:  Physical Exam Vitals reviewed.  Constitutional:      Appearance: Normal appearance.  HENT:     Head: Normocephalic and atraumatic.  Eyes:     Pupils: Pupils are equal, round, and reactive to light.  Cardiovascular:     Rate and Rhythm: Normal rate. Rhythm irregular.     Pulses: Normal pulses.  Pulmonary:     Effort: Pulmonary effort is normal.     Breath sounds: Normal breath sounds.  Abdominal:     Palpations: Abdomen is soft.  Musculoskeletal:     Cervical back: Normal range of motion and neck supple.     Left knee: Effusion, bony tenderness and crepitus present. Decreased range of motion. Tenderness present over the medial joint line, lateral  joint line and patellar tendon. Abnormal alignment.  Neurological:     Mental Status: She is alert and oriented to person, place, and time.  Psychiatric:        Behavior: Behavior normal.     Vital signs in last 24 hours:    Labs:   Estimated body mass index is 34.72 kg/m as calculated from the following:   Height as of 08/27/20: 5\' 3"  (1.6 m).   Weight as of 08/27/20: 88.9 kg.   Imaging Review Plain radiographs demonstrate severe degenerative joint disease of the left knee(s). The overall alignment ismild varus. The bone quality appears to be fair for age and reported activity level.      Assessment/Plan:  End stage arthritis, left knee   The patient history, physical examination, clinical judgment of the provider and imaging studies are consistent with end stage degenerative joint disease of the left knee(s) and total knee arthroplasty is deemed medically necessary. The treatment options including medical management, injection therapy arthroscopy and arthroplasty were discussed at length. The risks and benefits of total knee arthroplasty were presented and reviewed. The risks due to aseptic loosening, infection, stiffness, patella tracking problems, thromboembolic complications and other imponderables were discussed. The patient acknowledged the explanation, agreed to proceed with the plan and consent was signed. Patient is being admitted for inpatient treatment for surgery, pain control, PT, OT, prophylactic antibiotics, VTE prophylaxis, progressive ambulation and ADL's and discharge planning. The patient is planning to be discharged home with home health services

## 2020-09-04 ENCOUNTER — Ambulatory Visit (HOSPITAL_COMMUNITY): Payer: Medicare Other | Admitting: Anesthesiology

## 2020-09-04 ENCOUNTER — Other Ambulatory Visit: Payer: Self-pay

## 2020-09-04 ENCOUNTER — Observation Stay (HOSPITAL_COMMUNITY)
Admission: RE | Admit: 2020-09-04 | Discharge: 2020-09-05 | Disposition: A | Discharge status: Home / Self Care | Payer: Medicare Other | Attending: Orthopaedic Surgery | Admitting: Orthopaedic Surgery

## 2020-09-04 ENCOUNTER — Observation Stay (HOSPITAL_COMMUNITY): Payer: Medicare Other

## 2020-09-04 ENCOUNTER — Encounter (HOSPITAL_COMMUNITY): Payer: Self-pay | Admitting: Orthopaedic Surgery

## 2020-09-04 ENCOUNTER — Ambulatory Visit (HOSPITAL_COMMUNITY): Payer: Medicare Other | Admitting: Emergency Medicine

## 2020-09-04 ENCOUNTER — Encounter (HOSPITAL_COMMUNITY)
Admission: RE | Disposition: A | Discharge status: Home / Self Care | Payer: Self-pay | Source: Home / Self Care | Attending: Orthopaedic Surgery

## 2020-09-04 DIAGNOSIS — Z7901 Long term (current) use of anticoagulants: Secondary | ICD-10-CM | POA: Diagnosis not present

## 2020-09-04 DIAGNOSIS — Z7984 Long term (current) use of oral hypoglycemic drugs: Secondary | ICD-10-CM | POA: Insufficient documentation

## 2020-09-04 DIAGNOSIS — I129 Hypertensive chronic kidney disease with stage 1 through stage 4 chronic kidney disease, or unspecified chronic kidney disease: Secondary | ICD-10-CM | POA: Diagnosis not present

## 2020-09-04 DIAGNOSIS — M1712 Unilateral primary osteoarthritis, left knee: Secondary | ICD-10-CM | POA: Diagnosis present

## 2020-09-04 DIAGNOSIS — N183 Chronic kidney disease, stage 3 unspecified: Secondary | ICD-10-CM | POA: Diagnosis not present

## 2020-09-04 DIAGNOSIS — E1122 Type 2 diabetes mellitus with diabetic chronic kidney disease: Secondary | ICD-10-CM | POA: Insufficient documentation

## 2020-09-04 DIAGNOSIS — Z79899 Other long term (current) drug therapy: Secondary | ICD-10-CM | POA: Insufficient documentation

## 2020-09-04 DIAGNOSIS — Z7982 Long term (current) use of aspirin: Secondary | ICD-10-CM | POA: Diagnosis not present

## 2020-09-04 DIAGNOSIS — Z96652 Presence of left artificial knee joint: Secondary | ICD-10-CM

## 2020-09-04 HISTORY — PX: TOTAL KNEE ARTHROPLASTY: SHX125

## 2020-09-04 LAB — CBC WITH DIFFERENTIAL/PLATELET
Abs Immature Granulocytes: 0.08 10*3/uL — ABNORMAL HIGH (ref 0.00–0.07)
Basophils Absolute: 0 10*3/uL (ref 0.0–0.1)
Basophils Relative: 0 %
Eosinophils Absolute: 0 10*3/uL (ref 0.0–0.5)
Eosinophils Relative: 0 %
HCT: 31.3 % — ABNORMAL LOW (ref 36.0–46.0)
Hemoglobin: 9.6 g/dL — ABNORMAL LOW (ref 12.0–15.0)
Immature Granulocytes: 1 %
Lymphocytes Relative: 9 %
Lymphs Abs: 0.7 10*3/uL (ref 0.7–4.0)
MCH: 27.5 pg (ref 26.0–34.0)
MCHC: 30.7 g/dL (ref 30.0–36.0)
MCV: 89.7 fL (ref 80.0–100.0)
Monocytes Absolute: 0.3 10*3/uL (ref 0.1–1.0)
Monocytes Relative: 4 %
Neutro Abs: 6.1 10*3/uL (ref 1.7–7.7)
Neutrophils Relative %: 86 %
Platelets: 181 10*3/uL (ref 150–400)
RBC: 3.49 MIL/uL — ABNORMAL LOW (ref 3.87–5.11)
RDW: 15.8 % — ABNORMAL HIGH (ref 11.5–15.5)
WBC: 7.1 10*3/uL (ref 4.0–10.5)
nRBC: 0 % (ref 0.0–0.2)

## 2020-09-04 LAB — TYPE AND SCREEN
ABO/RH(D): O POS
Antibody Screen: NEGATIVE

## 2020-09-04 LAB — ABO/RH: ABO/RH(D): O POS

## 2020-09-04 LAB — GLUCOSE, CAPILLARY
Glucose-Capillary: 110 mg/dL — ABNORMAL HIGH (ref 70–99)
Glucose-Capillary: 149 mg/dL — ABNORMAL HIGH (ref 70–99)

## 2020-09-04 SURGERY — ARTHROPLASTY, KNEE, TOTAL
Anesthesia: Spinal | Site: Knee | Laterality: Left

## 2020-09-04 MED ORDER — OXYCODONE HCL 5 MG PO TABS: 10.0000 mg | ORAL_TABLET | ORAL | Status: DC | PRN

## 2020-09-04 MED ORDER — PHENYLEPHRINE HCL (PRESSORS) 10 MG/ML IV SOLN
INTRAVENOUS | Status: DC | PRN
  Administered 2020-09-04: 40 ug via INTRAVENOUS
  Administered 2020-09-04: 80 ug via INTRAVENOUS

## 2020-09-04 MED ORDER — MENTHOL 3 MG MT LOZG: 1.0000 | LOZENGE | OROMUCOSAL | Status: DC | PRN

## 2020-09-04 MED ORDER — METOPROLOL SUCCINATE ER 50 MG PO TB24
100.0000 mg | ORAL_TABLET | Freq: Every day | ORAL | Status: DC
  Administered 2020-09-05: 100 mg via ORAL
  Filled 2020-09-04: qty 2

## 2020-09-04 MED ORDER — ATORVASTATIN CALCIUM 40 MG PO TABS
40.0000 mg | ORAL_TABLET | Freq: Every day | ORAL | Status: DC
  Administered 2020-09-04 – 2020-09-05 (×2): 40 mg via ORAL
  Filled 2020-09-04 (×2): qty 1

## 2020-09-04 MED ORDER — METHOCARBAMOL 500 MG IVPB - SIMPLE MED
INTRAVENOUS | Status: AC
  Administered 2020-09-04: 500 mg via INTRAVENOUS
  Filled 2020-09-04: qty 50

## 2020-09-04 MED ORDER — OXYCODONE HCL 5 MG PO TABS: 5.0000 mg | ORAL_TABLET | Freq: Once | ORAL | Status: DC | PRN

## 2020-09-04 MED ORDER — ONDANSETRON HCL 4 MG/2ML IJ SOLN: 4.0000 mg | Freq: Four times a day (QID) | INTRAMUSCULAR | Status: DC | PRN

## 2020-09-04 MED ORDER — EPHEDRINE SULFATE 50 MG/ML IJ SOLN
INTRAMUSCULAR | Status: DC | PRN
  Administered 2020-09-04: 15 mg via INTRAVENOUS
  Administered 2020-09-04: 10 mg via INTRAVENOUS
  Administered 2020-09-04 (×2): 15 mg via INTRAVENOUS
  Administered 2020-09-04: 10 mg via INTRAVENOUS
  Administered 2020-09-04: 5 mg via INTRAVENOUS
  Administered 2020-09-04: 15 mg via INTRAVENOUS

## 2020-09-04 MED ORDER — FLECAINIDE ACETATE 50 MG PO TABS
50.0000 mg | ORAL_TABLET | Freq: Two times a day (BID) | ORAL | Status: DC
  Administered 2020-09-04 – 2020-09-05 (×2): 50 mg via ORAL
  Filled 2020-09-04 (×2): qty 1

## 2020-09-04 MED ORDER — ACETAMINOPHEN 500 MG PO TABS
ORAL_TABLET | ORAL | Status: AC
  Administered 2020-09-04: 1000 mg via ORAL
  Filled 2020-09-04: qty 2

## 2020-09-04 MED ORDER — PROPOFOL 500 MG/50ML IV EMUL
INTRAVENOUS | Status: DC | PRN
  Administered 2020-09-04: 150 mg via INTRAVENOUS
  Administered 2020-09-04: 50 mg via INTRAVENOUS

## 2020-09-04 MED ORDER — ALUM & MAG HYDROXIDE-SIMETH 200-200-20 MG/5ML PO SUSP: 30.0000 mL | ORAL | Status: DC | PRN

## 2020-09-04 MED ORDER — FENTANYL CITRATE (PF) 100 MCG/2ML IJ SOLN
INTRAMUSCULAR | Status: AC
  Administered 2020-09-04: 25 ug
  Filled 2020-09-04: qty 2

## 2020-09-04 MED ORDER — APIXABAN 5 MG PO TABS
5.0000 mg | ORAL_TABLET | Freq: Two times a day (BID) | ORAL | Status: DC
  Administered 2020-09-05: 5 mg via ORAL
  Filled 2020-09-04: qty 1

## 2020-09-04 MED ORDER — MIDAZOLAM HCL 2 MG/2ML IJ SOLN
1.0000 mg | INTRAMUSCULAR | Status: DC
  Filled 2020-09-04: qty 2

## 2020-09-04 MED ORDER — ACETAMINOPHEN 325 MG PO TABS
325.0000 mg | ORAL_TABLET | Freq: Four times a day (QID) | ORAL | Status: DC | PRN
  Administered 2020-09-05: 650 mg via ORAL
  Filled 2020-09-04: qty 2

## 2020-09-04 MED ORDER — ACETAMINOPHEN 500 MG PO TABS: 1000.0000 mg | ORAL_TABLET | Freq: Once | ORAL | Status: DC

## 2020-09-04 MED ORDER — EPHEDRINE 5 MG/ML INJ
INTRAVENOUS | Status: AC
  Filled 2020-09-04: qty 10

## 2020-09-04 MED ORDER — POLYVINYL ALCOHOL 1.4 % OP SOLN
2.0000 [drp] | Freq: Two times a day (BID) | OPHTHALMIC | Status: DC
  Administered 2020-09-04: 2 [drp] via OPHTHALMIC
  Filled 2020-09-04: qty 15

## 2020-09-04 MED ORDER — FENTANYL CITRATE (PF) 100 MCG/2ML IJ SOLN
50.0000 ug | INTRAMUSCULAR | Status: DC
  Administered 2020-09-04: 50 ug via INTRAVENOUS
  Filled 2020-09-04: qty 2

## 2020-09-04 MED ORDER — METHOCARBAMOL 500 MG PO TABS
500.0000 mg | ORAL_TABLET | Freq: Four times a day (QID) | ORAL | Status: DC | PRN
  Administered 2020-09-04 – 2020-09-05 (×2): 500 mg via ORAL
  Filled 2020-09-04 (×2): qty 1

## 2020-09-04 MED ORDER — PHENYLEPHRINE 40 MCG/ML (10ML) SYRINGE FOR IV PUSH (FOR BLOOD PRESSURE SUPPORT)
PREFILLED_SYRINGE | INTRAVENOUS | Status: AC
  Filled 2020-09-04: qty 10

## 2020-09-04 MED ORDER — CLONIDINE HCL (ANALGESIA) 100 MCG/ML EP SOLN
EPIDURAL | Status: DC | PRN
  Administered 2020-09-04: 100 ug

## 2020-09-04 MED ORDER — PANTOPRAZOLE SODIUM 40 MG PO TBEC
40.0000 mg | DELAYED_RELEASE_TABLET | Freq: Every day | ORAL | Status: DC
  Administered 2020-09-04 – 2020-09-05 (×2): 40 mg via ORAL
  Filled 2020-09-04 (×2): qty 1

## 2020-09-04 MED ORDER — FENTANYL CITRATE (PF) 100 MCG/2ML IJ SOLN
INTRAMUSCULAR | Status: AC
  Filled 2020-09-04: qty 2

## 2020-09-04 MED ORDER — HYDROMORPHONE HCL 1 MG/ML IJ SOLN
0.5000 mg | INTRAMUSCULAR | Status: DC | PRN
Start: 2020-09-04 — End: 2020-09-05

## 2020-09-04 MED ORDER — LACTATED RINGERS IV SOLN: INTRAVENOUS | Status: DC

## 2020-09-04 MED ORDER — TRANEXAMIC ACID-NACL 1000-0.7 MG/100ML-% IV SOLN
1000.0000 mg | INTRAVENOUS | Status: AC
  Administered 2020-09-04: 1000 mg via INTRAVENOUS
  Filled 2020-09-04: qty 100

## 2020-09-04 MED ORDER — FENTANYL CITRATE (PF) 100 MCG/2ML IJ SOLN
25.0000 ug | INTRAMUSCULAR | Status: DC | PRN
  Administered 2020-09-04 (×2): 25 ug via INTRAVENOUS

## 2020-09-04 MED ORDER — LIDOCAINE 2% (20 MG/ML) 5 ML SYRINGE
INTRAMUSCULAR | Status: DC | PRN
  Administered 2020-09-04: 100 mg via INTRAVENOUS

## 2020-09-04 MED ORDER — PROMETHAZINE HCL 25 MG/ML IJ SOLN: 6.2500 mg | INTRAMUSCULAR | Status: DC | PRN

## 2020-09-04 MED ORDER — ASPIRIN 81 MG PO TBEC: 81.0000 mg | DELAYED_RELEASE_TABLET | Freq: Every day | ORAL | Status: DC

## 2020-09-04 MED ORDER — OXYCODONE HCL 5 MG/5ML PO SOLN: 5.0000 mg | Freq: Once | ORAL | Status: DC | PRN

## 2020-09-04 MED ORDER — METHOCARBAMOL 500 MG IVPB - SIMPLE MED
500.0000 mg | Freq: Four times a day (QID) | INTRAVENOUS | Status: DC | PRN
  Filled 2020-09-04: qty 50

## 2020-09-04 MED ORDER — 0.9 % SODIUM CHLORIDE (POUR BTL) OPTIME
TOPICAL | Status: DC | PRN
  Administered 2020-09-04: 1000 mL

## 2020-09-04 MED ORDER — VITAMIN D 25 MCG (1000 UNIT) PO TABS
1000.0000 [IU] | ORAL_TABLET | Freq: Every day | ORAL | Status: DC
  Administered 2020-09-04 – 2020-09-05 (×2): 1000 [IU] via ORAL
  Filled 2020-09-04 (×2): qty 1

## 2020-09-04 MED ORDER — CEFAZOLIN SODIUM-DEXTROSE 2-4 GM/100ML-% IV SOLN
2.0000 g | INTRAVENOUS | Status: AC
  Administered 2020-09-04: 2 g via INTRAVENOUS
  Filled 2020-09-04: qty 100

## 2020-09-04 MED ORDER — PHENOL 1.4 % MT LIQD: 1.0000 | OROMUCOSAL | Status: DC | PRN

## 2020-09-04 MED ORDER — ASPIRIN EC 81 MG PO TBEC
81.0000 mg | DELAYED_RELEASE_TABLET | Freq: Every day | ORAL | Status: DC
  Administered 2020-09-05: 81 mg via ORAL
  Filled 2020-09-04: qty 1

## 2020-09-04 MED ORDER — ONDANSETRON HCL 4 MG/2ML IJ SOLN
INTRAMUSCULAR | Status: DC | PRN
  Administered 2020-09-04: 4 mg via INTRAVENOUS

## 2020-09-04 MED ORDER — CEFAZOLIN SODIUM-DEXTROSE 1-4 GM/50ML-% IV SOLN
1.0000 g | Freq: Four times a day (QID) | INTRAVENOUS | Status: AC
  Administered 2020-09-04 (×2): 1 g via INTRAVENOUS
  Filled 2020-09-04 (×2): qty 50

## 2020-09-04 MED ORDER — GABAPENTIN 300 MG PO CAPS
300.0000 mg | ORAL_CAPSULE | Freq: Every day | ORAL | Status: DC
  Administered 2020-09-04: 300 mg via ORAL
  Filled 2020-09-04: qty 1

## 2020-09-04 MED ORDER — ACETAMINOPHEN 500 MG PO TABS: 1000.0000 mg | ORAL_TABLET | Freq: Once | ORAL | Status: AC

## 2020-09-04 MED ORDER — METOCLOPRAMIDE HCL 5 MG PO TABS
5.0000 mg | ORAL_TABLET | Freq: Three times a day (TID) | ORAL | Status: DC | PRN
Start: 2020-09-04 — End: 2020-09-05

## 2020-09-04 MED ORDER — ONDANSETRON HCL 4 MG PO TABS: 4.0000 mg | ORAL_TABLET | Freq: Four times a day (QID) | ORAL | Status: DC | PRN

## 2020-09-04 MED ORDER — FENTANYL CITRATE (PF) 100 MCG/2ML IJ SOLN
INTRAMUSCULAR | Status: DC | PRN
  Administered 2020-09-04: 50 ug via INTRAVENOUS

## 2020-09-04 MED ORDER — HYPROMELLOSE (GONIOSCOPIC) 2.5 % OP SOLN
2.0000 [drp] | Freq: Every day | OPHTHALMIC | Status: DC
  Filled 2020-09-04: qty 15

## 2020-09-04 MED ORDER — METFORMIN HCL 500 MG PO TABS
500.0000 mg | ORAL_TABLET | Freq: Every day | ORAL | Status: DC
  Administered 2020-09-04: 500 mg via ORAL
  Filled 2020-09-04: qty 1

## 2020-09-04 MED ORDER — DIPHENHYDRAMINE HCL 12.5 MG/5ML PO ELIX: 12.5000 mg | ORAL_SOLUTION | ORAL | Status: DC | PRN

## 2020-09-04 MED ORDER — OXYCODONE HCL 5 MG PO TABS: 5.0000 mg | ORAL_TABLET | Freq: Four times a day (QID) | ORAL | 0 refills | Status: DC | PRN

## 2020-09-04 MED ORDER — METOCLOPRAMIDE HCL 5 MG/ML IJ SOLN: 5.0000 mg | Freq: Three times a day (TID) | INTRAMUSCULAR | Status: DC | PRN

## 2020-09-04 MED ORDER — CHOLECALCIFEROL 25 MCG (1000 UT) PO TABS: 1000.0000 [IU] | ORAL_TABLET | Freq: Every day | ORAL | Status: DC

## 2020-09-04 MED ORDER — SODIUM CHLORIDE 0.9 % IV SOLN: INTRAVENOUS | Status: DC

## 2020-09-04 MED ORDER — OXYCODONE HCL 5 MG PO TABS
5.0000 mg | ORAL_TABLET | ORAL | Status: DC | PRN
  Administered 2020-09-04 (×2): 10 mg via ORAL
  Administered 2020-09-05 (×2): 5 mg via ORAL
  Filled 2020-09-04: qty 2
  Filled 2020-09-04: qty 1
  Filled 2020-09-04: qty 2
  Filled 2020-09-04: qty 1

## 2020-09-04 MED ORDER — STERILE WATER FOR IRRIGATION IR SOLN
Status: DC | PRN
  Administered 2020-09-04: 2000 mL

## 2020-09-04 MED ORDER — POVIDONE-IODINE 10 % EX SWAB
2.0000 "application " | Freq: Once | CUTANEOUS | Status: AC
  Administered 2020-09-04: 2 via TOPICAL

## 2020-09-04 MED ORDER — SERTRALINE HCL 100 MG PO TABS
100.0000 mg | ORAL_TABLET | Freq: Every day | ORAL | Status: DC
  Administered 2020-09-05: 100 mg via ORAL
  Filled 2020-09-04: qty 1

## 2020-09-04 MED ORDER — BUPIVACAINE-EPINEPHRINE (PF) 0.25% -1:200000 IJ SOLN
INTRAMUSCULAR | Status: AC
  Filled 2020-09-04: qty 30

## 2020-09-04 MED ORDER — DOCUSATE SODIUM 100 MG PO CAPS
100.0000 mg | ORAL_CAPSULE | Freq: Two times a day (BID) | ORAL | Status: DC
  Administered 2020-09-04 – 2020-09-05 (×2): 100 mg via ORAL
  Filled 2020-09-04 (×2): qty 1

## 2020-09-04 MED ORDER — SODIUM CHLORIDE 0.9 % IR SOLN
Status: DC | PRN
  Administered 2020-09-04: 1000 mL

## 2020-09-04 MED ORDER — BUPIVACAINE-EPINEPHRINE (PF) 0.5% -1:200000 IJ SOLN
INTRAMUSCULAR | Status: DC | PRN
  Administered 2020-09-04: 15 mL via PERINEURAL

## 2020-09-04 SURGICAL SUPPLY — 54 items
BAG ZIPLOCK 12X15 (MISCELLANEOUS) ×2 IMPLANT
BASEPLATE TIBIAL TRIATHALON 2 (Plate) ×2 IMPLANT
BENZOIN TINCTURE PRP APPL 2/3 (GAUZE/BANDAGES/DRESSINGS) IMPLANT
BLADE SAG 18X100X1.27 (BLADE) ×2 IMPLANT
BLADE SURG SZ10 CARB STEEL (BLADE) ×4 IMPLANT
BNDG ELASTIC 6X5.8 VLCR STR LF (GAUZE/BANDAGES/DRESSINGS) ×2 IMPLANT
BOWL SMART MIX CTS (DISPOSABLE) ×2 IMPLANT
CEMENT BONE SIMPLEX SPEEDSET (Cement) ×2 IMPLANT
COOLER ICEMAN CLASSIC (MISCELLANEOUS) ×2 IMPLANT
COVER SURGICAL LIGHT HANDLE (MISCELLANEOUS) ×2 IMPLANT
COVER WAND RF STERILE (DRAPES) IMPLANT
CUFF TOURN SGL QUICK 34 (TOURNIQUET CUFF) ×1
CUFF TRNQT CYL 34X4.125X (TOURNIQUET CUFF) ×1 IMPLANT
DECANTER SPIKE VIAL GLASS SM (MISCELLANEOUS) IMPLANT
DRAPE U-SHAPE 47X51 STRL (DRAPES) ×2 IMPLANT
DRSG PAD ABDOMINAL 8X10 ST (GAUZE/BANDAGES/DRESSINGS) ×4 IMPLANT
DURAPREP 26ML APPLICATOR (WOUND CARE) ×2 IMPLANT
ELECT BLADE TIP CTD 4 INCH (ELECTRODE) ×2 IMPLANT
ELECT REM PT RETURN 15FT ADLT (MISCELLANEOUS) ×2 IMPLANT
FEMORAL COMP TRIATH LFT CEM 2 (Knees) ×2 IMPLANT
FEMORAL PEG DISTAL FIXATION (Orthopedic Implant) ×2 IMPLANT
GAUZE SPONGE 4X4 12PLY STRL (GAUZE/BANDAGES/DRESSINGS) ×2 IMPLANT
GAUZE XEROFORM 1X8 LF (GAUZE/BANDAGES/DRESSINGS) ×2 IMPLANT
GLOVE SRG 8 PF TXTR STRL LF DI (GLOVE) ×2 IMPLANT
GLOVE SURG ENC MOIS LTX SZ7.5 (GLOVE) ×2 IMPLANT
GLOVE SURG LTX SZ8 (GLOVE) ×2 IMPLANT
GLOVE SURG UNDER POLY LF SZ8 (GLOVE) ×2
GOWN STRL REUS W/TWL XL LVL3 (GOWN DISPOSABLE) ×4 IMPLANT
HANDPIECE INTERPULSE COAX TIP (DISPOSABLE) ×1
HOLDER FOLEY CATH W/STRAP (MISCELLANEOUS) ×2 IMPLANT
IMMOBILIZER KNEE 20 (SOFTGOODS) ×2
IMMOBILIZER KNEE 20 THIGH 36 (SOFTGOODS) ×1 IMPLANT
INSERT TRIATH PS SZ2 9 (Insert) ×2 IMPLANT
KIT TURNOVER KIT A (KITS) ×2 IMPLANT
NS IRRIG 1000ML POUR BTL (IV SOLUTION) ×2 IMPLANT
PACK TOTAL KNEE CUSTOM (KITS) ×2 IMPLANT
PAD COLD SHLDR WRAP-ON (PAD) ×2 IMPLANT
PADDING CAST COTTON 6X4 STRL (CAST SUPPLIES) ×4 IMPLANT
PATELLA TRIATHLON SZ 29 9 MM (Orthopedic Implant) ×2 IMPLANT
PENCIL SMOKE EVACUATOR (MISCELLANEOUS) IMPLANT
PIN FLUTED HEDLESS FIX 3.5X1/8 (PIN) ×2 IMPLANT
PROTECTOR NERVE ULNAR (MISCELLANEOUS) IMPLANT
SET HNDPC FAN SPRY TIP SCT (DISPOSABLE) ×1 IMPLANT
SET PAD KNEE POSITIONER (MISCELLANEOUS) ×2 IMPLANT
STAPLER VISISTAT 35W (STAPLE) ×2 IMPLANT
STRIP CLOSURE SKIN 1/2X4 (GAUZE/BANDAGES/DRESSINGS) IMPLANT
SUT MNCRL AB 4-0 PS2 18 (SUTURE) ×2 IMPLANT
SUT VIC AB 0 CT1 27 (SUTURE) ×1
SUT VIC AB 0 CT1 27XBRD ANTBC (SUTURE) ×1 IMPLANT
SUT VIC AB 1 CT1 36 (SUTURE) ×4 IMPLANT
SUT VIC AB 2-0 CT1 27 (SUTURE) ×2
SUT VIC AB 2-0 CT1 TAPERPNT 27 (SUTURE) ×2 IMPLANT
TRAY FOLEY MTR SLVR 16FR STAT (SET/KITS/TRAYS/PACK) ×2 IMPLANT
WATER STERILE IRR 1000ML POUR (IV SOLUTION) ×4 IMPLANT

## 2020-09-04 NOTE — Op Note (Signed)
Katie Zuniga, Katie Zuniga MEDICAL RECORD NO: [id number] ACCOUNT NO: [id number] DATE OF BIRTH: 06-07-1940 FACILITY: [facility]: [id number] PHYSICIAN: Vanita Panda. Magnus Ivan, MD  Operative Report   DATE OF PROCEDURE: 09/04/2020   PREOPERATIVE DIAGNOSIS:  Primary osteoarthritis and degenerative joint disease, left knee.  POSTOPERATIVE DIAGNOSIS:  Primary osteoarthritis and degenerative joint disease, left knee.  PROCEDURE:  Left total knee arthroplasty.  IMPLANTS:  Stryker Triathlon cemented knee system, a size 2 femur, size 2 tibial tray, 9 mm fixed bearing polyethylene insert, size 29 patellar button.  SURGEON:  Vanita Panda. Magnus Ivan, M.D.  ASSISTANT:  Richardean Canal, PA-C  ANESTHESIA:   1.  Attempted spinal. 2.  General. 3.  Left lower extremity adductor canal block.  ANTIBIOTICS:  2 grams IV Ancef.  ESTIMATED BLOOD LOSS:  Less than 100 mL.  TOURNIQUET TIME:  Less than 1 hour.  COMPLICATIONS:  None.  INDICATIONS:  The patient is an 80 year old female well known to me.  She has debilitating arthritis involving her left knee that has been well documented.  She has tried and failed all forms of conservative treatment.  Her left knee pain is daily and is  detrimentally affecting her mobility her quality of life and her activities of daily living.  Her x-rays and her physical exam correlate with severe arthritis of the left knee.  At this point, with the failure of conservative treatment that has tried,  she wished to proceed with a total knee arthroplasty.  We talked about the risk of acute blood loss anemia, nerve or vessel injury, fracture, infection, DVT, and implant failure.  We talked about her goals being decreased pain, improved mobility and  overall improve quality of life.  DESCRIPTION OF PROCEDURE:  After informed consent was obtained, and appropriate left knee was marked.  An adductor canal block was obtained in the holding room.  She was then brought to the operating room and  sat up on the operating table.  Spinal  anesthesia was attempted.  It was unsuccessful.  She was laid in supine position.  General anesthesia was obtained.  A nonsterile tourniquet was placed around his upper left thigh and left thigh, knee, leg, ankle, foot were prepped and draped in DuraPrep  and sterile drapes including a sterile stockinette.  A timeout was called.  She was identified, correct patient, correct left knee.  We then used an Esmarch to wrap that leg and tourniquet was inflated to 300 mm pressure.  I then made a direct midline  incision over the patella and carried this proximally and distally.  I dissected the knee joint and carried out a medial parapatellar arthrotomy finding moderate joint effusion and significant arthritis throughout the knee, especially in the medial  compartment.  We removed osteophytes from all three compartments and removed remnants of ACL, PCL, medial and lateral meniscus.  With the knee in a flexed position we used the extramedullary cutting guide for making our proximal tibia cut correcting for  varus and valgus and neutral slope.  We made our cut to take 9 mm off the high side.  We made this cut without difficulty.  We then used an intramedullary guide for making our distal femoral cut setting this for a left knee at 5 degrees externally  rotated for an 8 mm distal femoral cut.  We made this cut without difficulty and brought the knee back down to full extension and a 9 mm extension block giving [place] full extension.  We went back to the femur and put  our femoral sizing guide based off the  epicondylar axis.  Based off of this, we chose a size 2 femur.  We put a 4-in-1 cutting block for a size 2 femur, made our anterior and posterior cuts followed by chamfer cuts.  We then made our femoral box cut.  Attention was then turned to the tibia.   We chose a size 2 tibial tray for coverage setting the rotation of the tibial tubercle and the femur.  We made our keel punch off  of this.  With a size trial 2 tibia, we trialed a 2 left femur and a 9 mm fixed bearing trial polyethylene insert.  We were  pleased with range of motion and stability without insert.  We then drilled three holes for a size 29 patellar button after cutting the patella.  With all trial instrumentation in the knee, put the knee through range of motion we were pleased again with  stability.  We then removed all instrumentation from the knee and irrigated the knee again with normal saline solution.  We dried the knee real well and then mixed our cement.  We then cemented our Stryker Triathlon tibial tray size 2 followed by our  size 2 left femur.  We removed excess cement debris from the knee and placed our 9 mm fixed bearing polyethylene insert.  We then cemented our patellar button.  We removed again more the excess cement debris from the knee.  We held the knee in a fully  extended position and compressed the knee while cement hardened.  Once it hardened, we led the tourniquet down, hemostasis obtained with electrocautery.  We then closed the arthrotomy with interrupted #1 Vicryl suture followed by 0 Vicryl for the deep  tissue and 2-0 Vicryl to close the subcutaneous tissue.  The skin was closed with staples.  Well padded sterile dressing was applied.  She was awakened, extubated, and taken to recovery room in stable condition with all final counts being correct.  No  complications noted.  Of note, Rexene Edison, PA-C assisted the entire case and assistance was crucial for facilitating all aspects of this case.     SUJ D: 09/04/2020 1:02:58 pm T: 09/04/2020 11:27:00 pm  JOB: [id number]/ [id number]

## 2020-09-04 NOTE — Interval H&P Note (Signed)
History and Physical Interval Note: The patient understands that she is here today for a left total knee replacement to treat her left knee osteoarthritis.  There is been no acute or interval change in her medical status.  Please see recent H&P.  The risks and benefits of surgery have been explained in detail and informed consent is obtained.  The left knee has been marked.  09/04/2020 9:57 AM  Katie Zuniga  has presented today for surgery, with the diagnosis of osteoarthritis left knee.  The various methods of treatment have been discussed with the patient and family. After consideration of risks, benefits and other options for treatment, the patient has consented to  Procedure(s): LEFT TOTAL KNEE ARTHROPLASTY (Left) as a surgical intervention.  The patient's history has been reviewed, patient examined, no change in status, stable for surgery.  I have reviewed the patient's chart and labs.  Questions were answered to the patient's satisfaction.     [name]

## 2020-09-04 NOTE — Anesthesia Procedure Notes (Signed)
Procedure Name: LMA Insertion Date/Time: 09/04/2020 11:45 AM Performed by: Wynonia Sours, CRNA Pre-anesthesia Checklist: Patient identified, Emergency Drugs available, Suction available, Patient being monitored and Timeout performed Patient Re-evaluated:Patient Re-evaluated prior to induction Oxygen Delivery Method: Circle system utilized Preoxygenation: Pre-oxygenation with 100% oxygen Induction Type: IV induction LMA: LMA with gastric port inserted LMA Size: 4.0 Number of attempts: 2 Placement Confirmation: positive ETCO2 Tube secured with: Tape Dental Injury: Teeth and Oropharynx as per pre-operative assessment  Comments: #4 LMA regular placed with airleak. Switched to LMA with gastric port and seated.

## 2020-09-04 NOTE — Addendum Note (Signed)
Addendum  created 09/04/20 1427 by Kaylyn Layer, MD   Clinical Note Signed, Intraprocedure Event edited

## 2020-09-04 NOTE — Transfer of Care (Signed)
Immediate Anesthesia Transfer of Care Note  Patient: Katie Zuniga  Procedure(s) Performed: LEFT TOTAL KNEE ARTHROPLASTY (Left Knee)  Patient Location: PACU  Anesthesia Type:General  Level of Consciousness: awake, patient cooperative and responds to stimulation  Airway & Oxygen Therapy: Patient Spontanous Breathing and Patient connected to face mask oxygen  Post-op Assessment: Report given to RN and Post -op Vital signs reviewed and stable  Post vital signs: Reviewed and stable  Last Vitals:  Vitals Value Taken Time  BP 122/67 09/04/20 1336  Temp    Pulse 64 09/04/20 1338  Resp 25 09/04/20 1338  SpO2 98 % 09/04/20 1338  Vitals shown include unvalidated device data.  Last Pain:  Vitals:   09/04/20 0847  TempSrc: Oral         Complications: No complications documented.

## 2020-09-04 NOTE — Progress Notes (Signed)
Assisted Dr. Howze with left, ultrasound guided, adductor canal block. Side rails up, monitors on throughout procedure. See vital signs in flow sheet. Tolerated Procedure well.  

## 2020-09-04 NOTE — Discharge Instructions (Signed)

## 2020-09-04 NOTE — Brief Op Note (Signed)
09/04/2020  1:04 PM  PATIENT:  Johnathan Hausen  80 y.o. female  PRE-OPERATIVE DIAGNOSIS:  osteoarthritis left knee  POST-OPERATIVE DIAGNOSIS:  osteoarthritis left knee  PROCEDURE:  Procedure(s): LEFT TOTAL KNEE ARTHROPLASTY (Left)  SURGEON:  Surgeon(s) and Role:    [name], MD - Primary  PHYSICIAN ASSISTANT:  Rexene Edison, PA-C  ANESTHESIA:   regional and general  EBL:  25 mL   COUNTS:  YES  TOURNIQUET:   Total Tourniquet Time Documented: Thigh (Left) - 47 minutes Total: Thigh (Left) - 47 minutes   DICTATION: .Other Dictation: Dictation Number [id number]  PLAN OF CARE: Admit for overnight observation  PATIENT DISPOSITION:  PACU - hemodynamically stable.   Delay start of Pharmacological VTE agent (>24hrs) due to surgical blood loss or risk of bleeding: no

## 2020-09-04 NOTE — Anesthesia Postprocedure Evaluation (Addendum)
Anesthesia Post Note  Patient: Katie Zuniga  Procedure(s) Performed: LEFT TOTAL KNEE ARTHROPLASTY (Left Knee)     Patient location during evaluation: PACU Anesthesia Type: General Level of consciousness: awake and alert and oriented Pain management: pain level controlled Vital Signs Assessment: post-procedure vital signs reviewed and stable Respiratory status: spontaneous breathing, nonlabored ventilation and respiratory function stable Cardiovascular status: blood pressure returned to baseline Postop Assessment: no apparent nausea or vomiting and no headache Anesthetic complications: no   No complications documented.  Last Vitals:  Vitals:   09/04/20 1102 09/04/20 1335  BP: (!) 153/71   Pulse: (!) 59   Resp: 18   Temp:  36.4 C  SpO2: 100%                  Kaylyn Layer

## 2020-09-04 NOTE — Anesthesia Procedure Notes (Signed)
Anesthesia Regional Block: Adductor canal block   Pre-Anesthetic Checklist: ,, timeout performed, Correct Patient, Correct Site, Correct Laterality, Correct Procedure, Correct Position, site marked, Risks and benefits discussed, pre-op evaluation,  At surgeon's request and post-op pain management  Laterality: Left  Prep: Maximum Sterile Barrier Precautions used, chloraprep       Needles:  Injection technique: Single-shot  Needle Type: Echogenic Stimulator Needle     Needle Length: 9cm  Needle Gauge: 22     Additional Needles:   Procedures:,,,, ultrasound used (permanent image in chart),,,,  Narrative:  Start time: 09/04/2020 10:10 AM End time: 09/04/2020 10:43 AM Injection made incrementally with aspirations every 5 mL.  Performed by: Personally  Anesthesiologist: Kaylyn Layer, MD  Additional Notes: Risks, benefits, and alternative discussed. Patient gave consent for procedure. Patient prepped and draped in sterile fashion. Sedation administered, patient remains easily responsive to voice. Relevant anatomy identified with ultrasound guidance. Local anesthetic given in 5cc increments with no signs or symptoms of intravascular injection. No pain or paraesthesias with injection. Patient monitored throughout procedure with signs of LAST or immediate complications. Tolerated well. Ultrasound image placed in chart.  Katie Greenhouse, MD

## 2020-09-05 DIAGNOSIS — M1712 Unilateral primary osteoarthritis, left knee: Secondary | ICD-10-CM | POA: Diagnosis not present

## 2020-09-05 LAB — BASIC METABOLIC PANEL
Anion gap: 10 (ref 5–15)
BUN: 16 mg/dL (ref 8–23)
CO2: 25 mmol/L (ref 22–32)
Calcium: 8.6 mg/dL — ABNORMAL LOW (ref 8.9–10.3)
Chloride: 104 mmol/L (ref 98–111)
Creatinine, Ser: 1.05 mg/dL — ABNORMAL HIGH (ref 0.44–1.00)
GFR, Estimated: 54 mL/min — ABNORMAL LOW (ref >60)
Glucose, Bld: 172 mg/dL — ABNORMAL HIGH (ref 70–99)
Potassium: 4.6 mmol/L (ref 3.5–5.1)
Sodium: 139 mmol/L (ref 135–145)

## 2020-09-05 LAB — CBC
HCT: 30.2 % — ABNORMAL LOW (ref 36.0–46.0)
Hemoglobin: 9.3 g/dL — ABNORMAL LOW (ref 12.0–15.0)
MCH: 27.8 pg (ref 26.0–34.0)
MCHC: 30.8 g/dL (ref 30.0–36.0)
MCV: 90.1 fL (ref 80.0–100.0)
Platelets: 173 10*3/uL (ref 150–400)
RBC: 3.35 MIL/uL — ABNORMAL LOW (ref 3.87–5.11)
RDW: 15.6 % — ABNORMAL HIGH (ref 11.5–15.5)
WBC: 8.7 10*3/uL (ref 4.0–10.5)
nRBC: 0 % (ref 0.0–0.2)

## 2020-09-05 NOTE — Progress Notes (Signed)
Patient discharged to home w/ family. Given all belongings, instructions. Verbalized understanding of instructions. Escorted to pov via w/c. 

## 2020-09-05 NOTE — Progress Notes (Signed)
     Subjective: 1 Day Post-Op Procedure(s) (LRB): LEFT TOTAL KNEE ARTHROPLASTY (Left) Awake, alert and oriented x 4. Has been out of bed and dressing is changed to aquacell. Incision without drainage, no warmth, minimal  Swelling. Patient reports pain as moderate.    Objective:   VITALS:  Temp:  [97.5 F (36.4 C)-98.6 F (37 C)] 98 F (36.7 C) (05/28 0540) Pulse Rate:  [52-79] 78 (05/28 0540) Resp:  [8-22] 16 (05/28 0540) BP: (92-172)/(40-75) 126/43 (05/28 0540) SpO2:  [96 %-100 %] 100 % (05/28 0540)  Neurologically intact ABD soft Neurovascular intact Sensation intact distally Intact pulses distally Dorsiflexion/Plantar flexion intact Incision: dressing C/D/I and no drainage   LABS Recent Labs    09/04/20 2003 09/05/20 0342  HGB 9.6* 9.3*  WBC 7.1 8.7  PLT 181 173   Recent Labs    09/05/20 0342  NA 139  K 4.6  CL 104  CO2 25  BUN 16  CREATININE 1.05*  GLUCOSE 172*   No results for input(s): LABPT, INR in the last 72 hours.   Assessment/Plan: 1 Day Post-Op Procedure(s) (LRB): LEFT TOTAL KNEE ARTHROPLASTY (Left)  Advance diet Up with therapy Discharge home with home health  Vira Browns 09/05/2020, 8:59 AMPatient ID: Johnathan Hausen, female   DOB: [birth year], 80 y.o.   MRN: [id number]

## 2020-09-05 NOTE — Evaluation (Signed)
Physical Therapy Evaluation Patient Details Name: Katie Zuniga MRN: [id number] DOB: [birth year] Today's Date: 09/05/2020   History of Present Illness  Pt s/p L TKR and with hx of CKD, DM, back surgery, a-fib ablation and R THR x 4  Clinical Impression  Pt s/p L TKR and presents with decreased bil LE strength/ROM and post op pain limiting functional mobility.  Pt should progress well to dc home with 24/7 assist of family.  Pt's dtr will be staying with and is working PTA - dtr states she is comfortable with taking pt home at current level.  Pt also eager to dc home this date.    Follow Up Recommendations Home health PT    Equipment Recommendations  None recommended by PT    Recommendations for Other Services       Precautions / Restrictions Precautions Precautions: Fall;Knee Required Braces or Orthoses: Knee Immobilizer - Left Knee Immobilizer - Left: Discontinue once straight leg raise with < 10 degree lag (pt performed TKR this am) Restrictions Weight Bearing Restrictions: No Other Position/Activity Restrictions: WBAT      Mobility  Bed Mobility               General bed mobility comments: Pt up in chair and requests back to same    Transfers Overall transfer level: Needs assistance Equipment used: Rolling walker (2 wheeled) Transfers: Sit to/from Stand Sit to Stand: Min guard         General transfer comment: cues for LE management and use of UEs to self assist  Ambulation/Gait Ambulation/Gait assistance: Min assist;Min guard Gait Distance (Feet): 140 Feet Assistive device: Rolling walker (2 wheeled) Gait Pattern/deviations: Step-to pattern;Step-through pattern;Decreased step length - right;Decreased step length - left;Shuffle;Trunk flexed     General Gait Details: cues for posture, position from RW and initial sequence  Stairs            Wheelchair Mobility    Modified Rankin (Stroke Patients Only)       Balance Overall balance assessment:  Needs assistance Sitting-balance support: No upper extremity supported;Feet supported Sitting balance-[name]: Good     Standing balance support: No upper extremity supported Standing balance-[name]: Poor                               Pertinent Vitals/Pain Pain Assessment: 0-10 Pain Score: 4  Pain Location: L knee Pain Descriptors / Indicators: Aching;Sore Pain Intervention(s): Limited activity within patient's tolerance;Monitored during session;Premedicated before session;Ice applied    Home Living Family/patient expects to be discharged to:: Private residence Living Arrangements: Alone Available Help at Discharge: Family;Available 24 hours/day Type of Home: House Home Access: Level entry     Home Layout: One level Home Equipment: Walker - 2 wheels;Cane - single point;Bedside commode;Shower seat Additional Comments: Per dtr, pt's home is handicap accessible 2* multiple R hip surgeries since 1966    Prior Function Level of Independence: Independent with assistive device(s)         Comments: Rollator     Hand Dominance        Extremity/Trunk Assessment   Upper Extremity Assessment Upper Extremity Assessment: Overall WFL for tasks assessed    Lower Extremity Assessment Lower Extremity Assessment: LLE deficits/detail;RLE deficits/detail RLE Deficits / Details: generalized weakness with knee flex and hip flex ltd ~90 LLE Deficits / Details: 3/5 quads with IND SLR; AAROM at knee - 5- 80       Communication  Communication: HOH  Cognition Arousal/Alertness: Awake/alert Behavior During Therapy: WFL for tasks assessed/performed Overall Cognitive Status: Within Functional Limits for tasks assessed                                        General Comments      Exercises Total Joint Exercises Ankle Circles/Pumps: AROM;Both;15 reps;Supine Quad Sets: AROM;Both;10 reps;Supine Heel Slides: AAROM;Left;15 reps;Supine Hip  ABduction/ADduction: AAROM;AROM;Left;15 reps;Supine   Assessment/Plan    PT Assessment Patient needs continued PT services  PT Problem List Decreased strength;Decreased range of motion;Decreased balance;Decreased activity tolerance;Decreased mobility;Decreased knowledge of use of DME;Pain;Obesity       PT Treatment Interventions DME instruction;Gait training;Functional mobility training;Therapeutic activities;Therapeutic exercise;Patient/family education    PT Goals (Current goals can be found in the Care Plan section)  Acute Rehab PT Goals Patient Stated Goal: REgain IND PT Goal Formulation: With patient Time For Goal Achievement: 09/11/20 Potential to Achieve Goals: Good    Frequency 7X/week   Barriers to discharge        Co-evaluation               AM-PAC PT "6 Clicks" Mobility  Outcome Measure Help needed turning from your back to your side while in a flat bed without using bedrails?: A Little Help needed moving from lying on your back to sitting on the side of a flat bed without using bedrails?: A Little Help needed moving to and from a bed to a chair (including a wheelchair)?: A Little Help needed standing up from a chair using your arms (e.g., wheelchair or bedside chair)?: A Little Help needed to walk in hospital room?: A Little Help needed climbing 3-5 steps with a railing? : A Little 6 Click Score: 18    End of Session Equipment Utilized During Treatment: Gait belt Activity Tolerance: Patient tolerated treatment well Patient left: in chair;with call bell/phone within reach;with family/visitor present Nurse Communication: Mobility status PT Visit Diagnosis: Difficulty in walking, not elsewhere classified (R26.2)    Time: 1660-6301 PT Time Calculation (min) (ACUTE ONLY): 36 min   Charges:   PT Evaluation $PT Eval Low Complexity: 1 Low PT Treatments $Gait Training: 8-22 mins        Mauro Kaufmann PT Acute Rehabilitation Services Pager  [phone] Office [phone]   Clydean Posas 09/05/2020, 12:58 PM

## 2020-09-08 ENCOUNTER — Encounter (HOSPITAL_COMMUNITY): Payer: Self-pay | Admitting: Orthopaedic Surgery

## 2020-09-08 NOTE — Discharge Summary (Signed)
Patient ID: Katie Zuniga MRN: [id number] DOB/AGE: [birth year] 80 y.o.  Admit date: 09/04/2020 Discharge date: 09/08/2020  Admission Diagnoses:  Principal Problem:   Unilateral primary osteoarthritis, left knee Active Problems:   Status post left knee replacement   Discharge Diagnoses:  Same  Past Medical History:  Diagnosis Date  . Anxiety   . Arthritis   . Chronic kidney disease    stage 3   . Depression   . Diabetes mellitus without complication (HCC)    type 2   . Dysrhythmia    afib  . Heart murmur   . Hypertension   . Pneumonia    hx of 06/2020 in hospital in [place]   . PONV (postoperative nausea and vomiting)    hx of years ago     Surgeries: Procedure(s): LEFT TOTAL KNEE ARTHROPLASTY on 09/04/2020   Consultants:   Discharged Condition: Improved  Hospital Course: Katie Zuniga is an 80 y.o. female who was admitted 09/04/2020 for operative treatment ofUnilateral primary osteoarthritis, left knee. Patient has severe unremitting pain that affects sleep, daily activities, and work/hobbies. After pre-op clearance the patient was taken to the operating room on 09/04/2020 and underwent  Procedure(s): LEFT TOTAL KNEE ARTHROPLASTY.    Patient was given perioperative antibiotics:  Anti-infectives (From admission, onward)   Start     Dose/Rate Route Frequency Ordered Stop   09/04/20 1800  ceFAZolin (ANCEF) IVPB 1 g/50 mL premix        1 g 100 mL/hr over 30 Minutes Intravenous Every 6 hours 09/04/20 1527 09/04/20 2349   09/04/20 0845  ceFAZolin (ANCEF) IVPB 2g/100 mL premix        2 g 200 mL/hr over 30 Minutes Intravenous On call to O.R. 09/04/20 0830 09/04/20 1218       Patient was given sequential compression devices, early ambulation, and chemoprophylaxis to prevent DVT.  Patient benefited maximally from hospital stay and there were no complications.    Recent vital signs: No data found.   Recent laboratory studies: No results for input(s): WBC, HGB, HCT, PLT, NA,  K, CL, CO2, BUN, CREATININE, GLUCOSE, INR, CALCIUM in the last 72 hours.  Invalid input(s): PT, 2   Discharge Medications:   Allergies as of 09/05/2020      Reactions   Morphine And Related Itching, Rash   Sulfa Antibiotics Rash   Tramadol Other (See Comments)   "kept me awake" "keeps awake at night"      Medication List    TAKE these medications   acetaminophen 500 MG tablet Commonly known as: TYLENOL Take 500 mg by mouth every 6 (six) hours as needed.   aspirin 81 MG EC tablet Take 81 mg by mouth daily.   atorvastatin 40 MG tablet Commonly known as: LIPITOR Take 40 mg by mouth daily.   Cholecalciferol 25 MCG (1000 UT) tablet Take 1,000 Units by mouth daily.   Eliquis 5 MG Tabs tablet Generic drug: apixaban Take 5 mg by mouth 2 (two) times daily.   flecainide 50 MG tablet Commonly known as: TAMBOCOR Take 50 mg by mouth 2 (two) times daily.   gabapentin 300 MG capsule Commonly known as: NEURONTIN Take 300 mg by mouth at bedtime.   hydroxypropyl methylcellulose / hypromellose 2.5 % ophthalmic solution Commonly known as: ISOPTO TEARS / GONIOVISC Place 2 drops into both eyes daily.   Melatonin 10 MG Tabs Take 10 mg by mouth at bedtime.   metFORMIN 500 MG tablet Commonly known as: GLUCOPHAGE Take 500 mg by mouth  every evening.   metoprolol succinate 50 MG 24 hr tablet Commonly known as: TOPROL-XL Take 100 mg by mouth daily.   oxyCODONE 5 MG immediate release tablet Commonly known as: Oxy IR/ROXICODONE Take 1-2 tablets (5-10 mg total) by mouth every 6 (six) hours as needed for moderate pain (pain score 4-6).   sertraline 100 MG tablet Commonly known as: ZOLOFT Take 100 mg by mouth daily.       Diagnostic Studies: DG Knee Left Port  Result Date: 09/04/2020 CLINICAL DATA:  Status post left knee replacement. EXAM: PORTABLE LEFT KNEE - 1-2 VIEW COMPARISON:  August 04, 2020. FINDINGS: The left femoral and tibial components are well situated. Expected  postoperative changes are noted in the soft tissues anteriorly. IMPRESSION: Status post left total knee arthroplasty. Electronically Signed   By: Lupita Raider M.D.   On: 09/04/2020 14:58    Disposition: Discharge disposition: 01-Home or Self Care       Discharge Instructions    Call MD / Call 911   Complete by: As directed    If you experience chest pain or shortness of breath, CALL 911 and be transported to the hospital emergency room.  If you develope a fever above 101 F, pus (white drainage) or increased drainage or redness at the wound, or calf pain, call your surgeon's office.   Constipation Prevention   Complete by: As directed    Drink plenty of fluids.  Prune juice may be helpful.  You may use a stool softener, such as Colace (over the counter) 100 mg twice a day.  Use MiraLax (over the counter) for constipation as needed.   Diet - low sodium heart healthy   Complete by: As directed    Discharge instructions   Complete by: As directed    INSTRUCTIONS AFTER JOINT REPLACEMENT   Remove items at home which could result in a fall. This includes throw rugs or furniture in walking pathways ICE to the affected joint every three hours while awake for 30 minutes at a time, for at least the first 3-5 days, and then as needed for pain and swelling.  Continue to use ice for pain and swelling. You may notice swelling that will progress down to the foot and ankle.  This is normal after surgery.  Elevate your leg when you are not up walking on it.   Continue to use the breathing machine you got in the hospital (incentive spirometer) which will help keep your temperature down.  It is common for your temperature to cycle up and down following surgery, especially at night when you are not up moving around and exerting yourself.  The breathing machine keeps your lungs expanded and your temperature down.   DIET:  As you were doing prior to hospitalization, we recommend a well-balanced  diet.  DRESSING / WOUND CARE / SHOWERING  Keep the surgical dressing until follow up.  The dressing is water proof, so you can shower without any extra covering.  IF THE DRESSING FALLS OFF or the wound gets wet inside, change the dressing with sterile gauze.  Please use good hand washing techniques before changing the dressing.  Do not use any lotions or creams on the incision until instructed by your surgeon.    ACTIVITY  Increase activity slowly as tolerated, but follow the weight bearing instructions below.   No driving for 6 weeks or until further direction given by your physician.  You cannot drive while taking narcotics.  No lifting or  carrying greater than 10 lbs. until further directed by your surgeon. Avoid periods of inactivity such as sitting longer than an hour when not asleep. This helps prevent blood clots.  You may return to work once you are authorized by your doctor.     WEIGHT BEARING   Weight bearing as tolerated with assist device (walker, cane, etc) as directed, use it as long as suggested by your surgeon or therapist, typically at least 4-6 weeks.   EXERCISES  Results after joint replacement surgery are often greatly improved when you follow the exercise, range of motion and muscle strengthening exercises prescribed by your doctor. Safety measures are also important to protect the joint from further injury. Any time any of these exercises cause you to have increased pain or swelling, decrease what you are doing until you are comfortable again and then slowly increase them. If you have problems or questions, call your caregiver or physical therapist for advice.   Rehabilitation is important following a joint replacement. After just a few days of immobilization, the muscles of the leg can become weakened and shrink (atrophy).  These exercises are designed to build up the tone and strength of the thigh and leg muscles and to improve motion. Often times heat used for twenty  to thirty minutes before working out will loosen up your tissues and help with improving the range of motion but do not use heat for the first two weeks following surgery (sometimes heat can increase post-operative swelling).   These exercises can be done on a training (exercise) mat, on the floor, on a table or on a bed. Use whatever works the best and is most comfortable for you.    Use music or television while you are exercising so that the exercises are a pleasant break in your day. This will make your life better with the exercises acting as a break in your routine that you can look forward to.   Perform all exercises about fifteen times, three times per day or as directed.  You should exercise both the operative leg and the other leg as well.  Exercises include:   Quad Sets - Tighten up the muscle on the front of the thigh (Quad) and hold for 5-10 seconds.   Straight Leg Raises - With your knee straight (if you were given a brace, keep it on), lift the leg to 60 degrees, hold for 3 seconds, and slowly lower the leg.  Perform this exercise against resistance later as your leg gets stronger.  Leg Slides: Lying on your back, slowly slide your foot toward your buttocks, bending your knee up off the floor (only go as far as is comfortable). Then slowly slide your foot back down until your leg is flat on the floor again.  Angel Wings: Lying on your back spread your legs to the side as far apart as you can without causing discomfort.  Hamstring Strength:  Lying on your back, push your heel against the floor with your leg straight by tightening up the muscles of your buttocks.  Repeat, but this time bend your knee to a comfortable angle, and push your heel against the floor.  You may put a pillow under the heel to make it more comfortable if necessary.   A rehabilitation program following joint replacement surgery can speed recovery and prevent re-injury in the future due to weakened muscles. Contact your  doctor or a physical therapist for more information on knee rehabilitation.    CONSTIPATION  Constipation is defined medically as fewer than three stools per week and severe constipation as less than one stool per week.  Even if you have a regular bowel pattern at home, your normal regimen is likely to be disrupted due to multiple reasons following surgery.  Combination of anesthesia, postoperative narcotics, change in appetite and fluid intake all can affect your bowels.   YOU MUST use at least one of the following options; they are listed in order of increasing strength to get the job done.  They are all available over the counter, and you may need to use some, POSSIBLY even all of these options:    Drink plenty of fluids (prune juice may be helpful) and high fiber foods Colace 100 mg by mouth twice a day  Senokot for constipation as directed and as needed Dulcolax (bisacodyl), take with full glass of water  Miralax (polyethylene glycol) once or twice a day as needed.  If you have tried all these things and are unable to have a bowel movement in the first 3-4 days after surgery call either your surgeon or your primary doctor.    If you experience loose stools or diarrhea, hold the medications until you stool forms back up.  If your symptoms do not get better within 1 week or if they get worse, check with your doctor.  If you experience "the worst abdominal pain ever" or develop nausea or vomiting, please contact the office immediately for further recommendations for treatment.   ITCHING:  If you experience itching with your medications, try taking only a single pain pill, or even half a pain pill at a time.  You can also use Benadryl over the counter for itching or also to help with sleep.   TED HOSE STOCKINGS:  Use stockings on both legs until for at least 2 weeks or as directed by physician office. They may be removed at night for sleeping.  MEDICATIONS:  See your medication summary on the  "After Visit Summary" that nursing will review with you.  You may have some home medications which will be placed on hold until you complete the course of blood thinner medication.  It is important for you to complete the blood thinner medication as prescribed.  PRECAUTIONS:  If you experience chest pain or shortness of breath - call 911 immediately for transfer to the hospital emergency department.   If you develop a fever greater that 101 F, purulent drainage from wound, increased redness or drainage from wound, foul odor from the wound/dressing, or calf pain - CONTACT YOUR SURGEON.                                                   FOLLOW-UP APPOINTMENTS:  If you do not already have a post-op appointment, please call the office for an appointment to be seen by your surgeon.  Guidelines for how soon to be seen are listed in your "After Visit Summary", but are typically between 1-4 weeks after surgery.  OTHER INSTRUCTIONS:   Knee Replacement:  Do not place pillow under knee, focus on keeping the knee straight while resting. CPM instructions: 0-90 degrees, 2 hours in the morning, 2 hours in the afternoon, and 2 hours in the evening. Place foam block, curve side up under heel at all times except when in CPM or when walking.  DO NOT modify, tear, cut, or change the foam block in any way.  POST-OPERATIVE OPIOID TAPER INSTRUCTIONS: It is important to wean off of your opioid medication as soon as possible. If you do not need pain medication after your surgery it is ok to stop day one. Opioids include: Codeine, Hydrocodone(Norco, Vicodin), Oxycodone(Percocet, oxycontin) and hydromorphone amongst others.  Long term and even short term use of opiods can cause: Increased pain response Dependence Constipation Depression Respiratory depression And more.  Withdrawal symptoms can include Flu like symptoms Nausea, vomiting And more Techniques to manage these symptoms Hydrate well Eat regular healthy  meals Stay active Use relaxation techniques(deep breathing, meditating, yoga) Do Not substitute Alcohol to help with tapering If you have been on opioids for less than two weeks and do not have pain than it is ok to stop all together.  Plan to wean off of opioids This plan should start within one week post op of your joint replacement. Maintain the same interval or time between taking each dose and first decrease the dose.  Cut the total daily intake of opioids by one tablet each day Next start to increase the time between doses. The last dose that should be eliminated is the evening dose.     MAKE SURE YOU:  Understand these instructions.  Get help right away if you are not doing well or get worse.    Thank you for letting us be a part of your medical care team.  It is a privilege we respect greatly.  We hope these instructions will help you stay on track for a fast and full recovery!     Dental Antibiotics:  In most cases prophylactic antibiotics for Dental procdeures after total joint surgery are not necessary.  Exceptions are as follows:  1. History of prior total joint infection  2. Severely immunocompromised (Organ Transplant, cancer chemotherapy, Rheumatoid biologic meds such as Humera)  3. Poorly controlled diabetes (A1C &gt; 8.0, blood glucose over 200)  If you have one of these conditions, contact your surgeon for an antibiotic prescription, prior to your dental procedure.   Increase activity slowly as tolerated   Complete by: As directed    Post-operative opioid taper instructions:   Complete by: As directed    POST-OPERATIVE OPIOID TAPER INSTRUCTIONS: It is important to wean off of your opioid medication as soon as possible. If you do not need pain medication after your surgery it is ok to stop day one. Opioids include: Codeine, Hydrocodone(Norco, Vicodin), Oxycodone(Percocet, oxycontin) and hydromorphone amongst others.  Long term and even short term use of  opiods can cause: Increased pain response Dependence Constipation Depression Respiratory depression And more.  Withdrawal symptoms can include Flu like symptoms Nausea, vomiting And more Techniques to manage these symptoms Hydrate well Eat regular healthy meals Stay active Use relaxation techniques(deep breathing, meditating, yoga) Do Not substitute Alcohol to help with tapering If you have been on opioids for less than two weeks and do not have pain than it is ok to stop all together.  Plan to wean off of opioids This plan should start within one week post op of your joint replacement. Maintain the same interval or time between taking each dose and first decrease the dose.  Cut the total daily intake of opioids by one tablet each day Next start to increase the time between doses. The last dose that should be eliminated is the evening dose.          Follow-up Information  [name], MD Follow up in 2 week(s).   Specialty: Orthopedic Surgery Contact information: [address] [place] [address] [phone]                Signed: Richardean Canal 09/08/2020, 10:40 AM

## 2020-09-17 ENCOUNTER — Encounter: Payer: Self-pay | Admitting: Physician Assistant

## 2020-09-17 ENCOUNTER — Ambulatory Visit (INDEPENDENT_AMBULATORY_CARE_PROVIDER_SITE_OTHER): Payer: Medicare Other | Admitting: Physician Assistant

## 2020-09-17 DIAGNOSIS — Z96652 Presence of left artificial knee joint: Secondary | ICD-10-CM

## 2020-09-17 MED ORDER — TIZANIDINE HCL 2 MG PO CAPS: 2.0000 mg | ORAL_CAPSULE | Freq: Every evening | ORAL | 0 refills | Status: AC | PRN

## 2020-09-17 NOTE — Progress Notes (Signed)
HPI: Katie Zuniga returns today 2 weeks status post left total knee arthroplasty.  Patient overall doing well.  She is taking Tylenol for pain.  She is having some difficulty sleeping due to muscle spasm at night.  She is on chronic Eliquis and aspirin.  She has had no fevers or chills.  Left knee: Surgical incisions healing well no signs of infection.  No wound dehiscence.  Staples well approximate the skin.  Left calf supple nontender.  Dorsiflexion plantarflexion left ankle intact.  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  Staples removed today Steri-Strips applied.  She will get the incision wet.  Scar tissue mobilization encouraged.  Follow-up with [place] in 1 month sooner if there is any questions concerns.  Prescriptions written for outpatient physical therapy.

## 2020-09-18 ENCOUNTER — Telehealth: Payer: Self-pay | Admitting: Orthopaedic Surgery

## 2020-09-18 NOTE — Telephone Encounter (Signed)
Vernona Rieger with [facility] called stating she is seeing the pt and was informed the pt took her zanaflex as prescribed and it has her feeling lethargic. Vernona Rieger states she took the pts pulse and it was 52 and then she took her BP manually and it was 90/48. Vernona Rieger wanted to make Dr. Magnus Ivan aware and would like a CB if there's any questions or concerns.   Vernona Rieger CB# [phone]

## 2020-09-18 NOTE — Telephone Encounter (Signed)
Called pt and advised to discontinue the xanaflex and to call with any other questions or concerns.

## 2020-10-15 ENCOUNTER — Ambulatory Visit: Payer: Medicare Other | Admitting: Physician Assistant

## 2020-10-21 ENCOUNTER — Other Ambulatory Visit: Payer: Self-pay

## 2020-10-21 ENCOUNTER — Encounter: Payer: Self-pay | Admitting: Physician Assistant

## 2020-10-21 ENCOUNTER — Ambulatory Visit (INDEPENDENT_AMBULATORY_CARE_PROVIDER_SITE_OTHER): Payer: Medicare Other | Admitting: Physician Assistant

## 2020-10-21 DIAGNOSIS — Z96652 Presence of left artificial knee joint: Secondary | ICD-10-CM

## 2020-10-21 NOTE — Progress Notes (Signed)
HPI: Katie Zuniga comes in today status post left total knee arthroplasty 09/04/2020.  She is overall doing well.  Is not taking any pain medications.  She takes occasional Tylenol.  She has no concerns no complaints.  States overall the knee is doing great.  Physical exam: Left knee full extension full flexion.  No instability valgus varus stressing.  Surgical incisions well-healed.  Calf supple nontender.  Ambulates without any assistive device.  Impression: Status post left total knee arthroplasty  Plan: She will continue to work on scar tissue mobilization.  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  Follow-up with [place] in 4 months we will obtain an AP and lateral view of the knee at that time.  She will follow-up with [place] sooner if there is any questions concerns.  Questions were encouraged and answered.

## 2020-12-28 ENCOUNTER — Telehealth: Payer: Self-pay | Admitting: Orthopaedic Surgery
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
## 2020-12-28 NOTE — Telephone Encounter (Signed)
Let patient know that if she isn't having any pain or discomfort with the knee, all is well. She doesn't need to come in just to have it checked

## 2021-02-22 ENCOUNTER — Encounter: Payer: Self-pay | Admitting: Physician Assistant

## 2021-02-22 ENCOUNTER — Ambulatory Visit: Payer: Self-pay

## 2021-02-22 ENCOUNTER — Other Ambulatory Visit: Payer: Self-pay

## 2021-02-22 ENCOUNTER — Ambulatory Visit (INDEPENDENT_AMBULATORY_CARE_PROVIDER_SITE_OTHER): Payer: Medicare Other | Admitting: Physician Assistant

## 2021-02-22 DIAGNOSIS — Z96652 Presence of left artificial knee joint: Secondary | ICD-10-CM

## 2021-02-22 DIAGNOSIS — M25562 Pain in left knee: Secondary | ICD-10-CM | POA: Diagnosis not present
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
## 2021-09-13 ENCOUNTER — Ambulatory Visit: Payer: Medicare Other | Admitting: Physician Assistant

## 2021-10-13 ENCOUNTER — Ambulatory Visit: Payer: Medicare Other | Admitting: Physician Assistant

## 2021-10-14 ENCOUNTER — Encounter: Payer: Self-pay | Admitting: Physician Assistant

## 2021-10-14 ENCOUNTER — Ambulatory Visit (INDEPENDENT_AMBULATORY_CARE_PROVIDER_SITE_OTHER): Payer: Medicare Other

## 2021-10-14 ENCOUNTER — Ambulatory Visit (INDEPENDENT_AMBULATORY_CARE_PROVIDER_SITE_OTHER): Payer: Medicare Other | Admitting: Physician Assistant

## 2021-10-14 DIAGNOSIS — Z96652 Presence of left artificial knee joint: Secondary | ICD-10-CM

## 2021-10-14 NOTE — Progress Notes (Signed)
Office Visit Note   Patient: Katie Zuniga           Date of Birth: 1940-07-09           MRN: [id number] Visit Date: 10/14/2021              Requested by: Katie Zuniga No address on file PCP: [facility]   Assessment & Plan: Visit Diagnoses:  1. Status post total left knee replacement     Plan: She will continue to work on strengthening the knee.  Follow-up with [place] as needed or if she has any questions or concerns.  Questions were encouraged and answered at length  Follow-Up Instructions: Return if symptoms worsen or fail to improve.   Orders:  Orders Placed This Encounter  Procedures   XR Knee 1-2 Views Left   No orders of the defined types were placed in this encounter.     Procedures: No procedures performed   Clinical Data: No additional findings.   Subjective: Chief Complaint  Patient presents with   Left Knee - Routine Post Op    HPI 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  She states that her left knee is overall doing well.  She has no concerns.  She is riding bike an hour a day.  She did recently have to undergo an ablation due to A-fib.  Review of Systems  Constitutional:  Negative for chills and fever.     Objective: Vital Signs: There were no vitals taken for this visit.  Physical Exam General: Well-developed well-nourished female who ambulates without any assistive device with a nonantalgic gait. Ortho Exam Left knee: Surgical incisions well-healed.  She has full extension and flexion to at least 115 degrees.  No gross instability valgus varus stressing. Specialty Comments:  No specialty comments available.  Imaging: XR Knee 1-2 Views Left  Result Date: 10/14/2021 Left knee 2 views: Status post left total knee arthroplasty well-seated components.  No hardware failure.  No acute findings or fracture.  Knee is well located.    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 No pertinent family history.  Past Surgical History:  Procedure Laterality Date   afib ablation     BACK SURGERY     CESAREAN SECTION     x 2   CHOLECYSTECTOMY     JOINT REPLACEMENT     TOTAL KNEE ARTHROPLASTY Left 09/04/2020   Procedure: LEFT TOTAL KNEE ARTHROPLASTY;  Surgeon: Katie Hitch, MD;  Location: [facility];  Service: Orthopedics;  Laterality: Left;   Social History   Occupational History   Not on file  Tobacco Use   Smoking status: Never   Smokeless tobacco: Never  Vaping Use   Vaping Use: Never used  Substance and Sexual Activity  Alcohol use: Never   Drug use: Never   Sexual activity: Not on file

## 2022-06-16 ENCOUNTER — Encounter: Payer: Self-pay | Admitting: Radiology

## 2022-10-07 IMAGING — DX DG KNEE 1-2V PORT*L*
2 series · 2 of 2 positions shown · non-contrast
Comparison: August 04, 2020.

CLINICAL DATA: Status post left knee replacement.

EXAM:
PORTABLE LEFT KNEE - 1-2 VIEW

[knee lat]
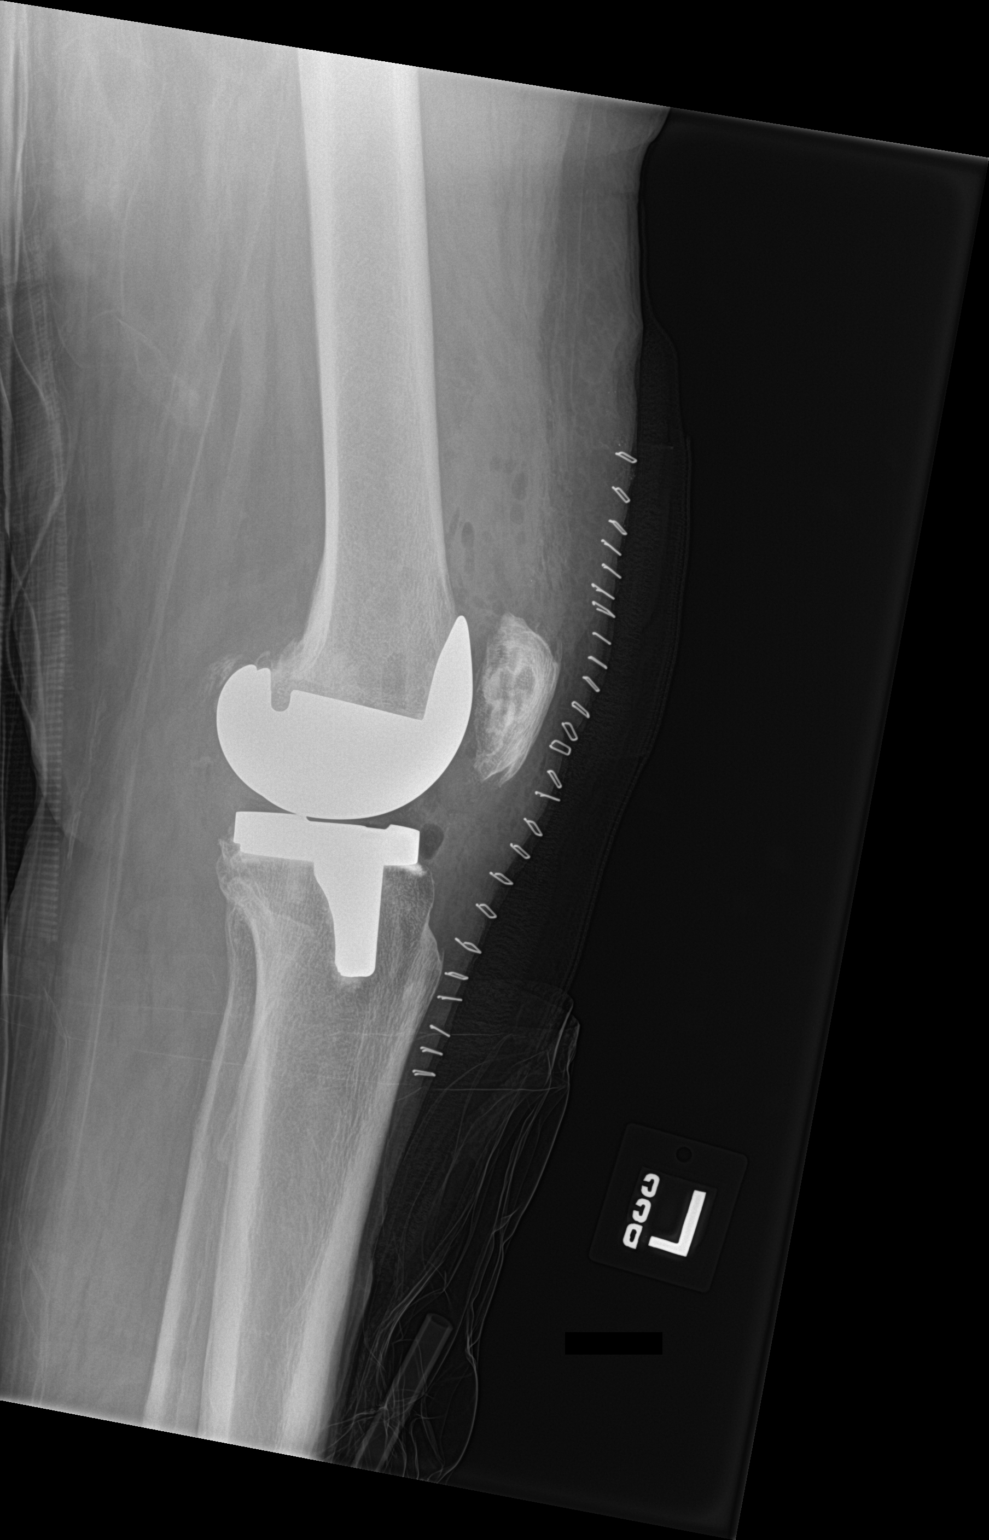

[knee ap]
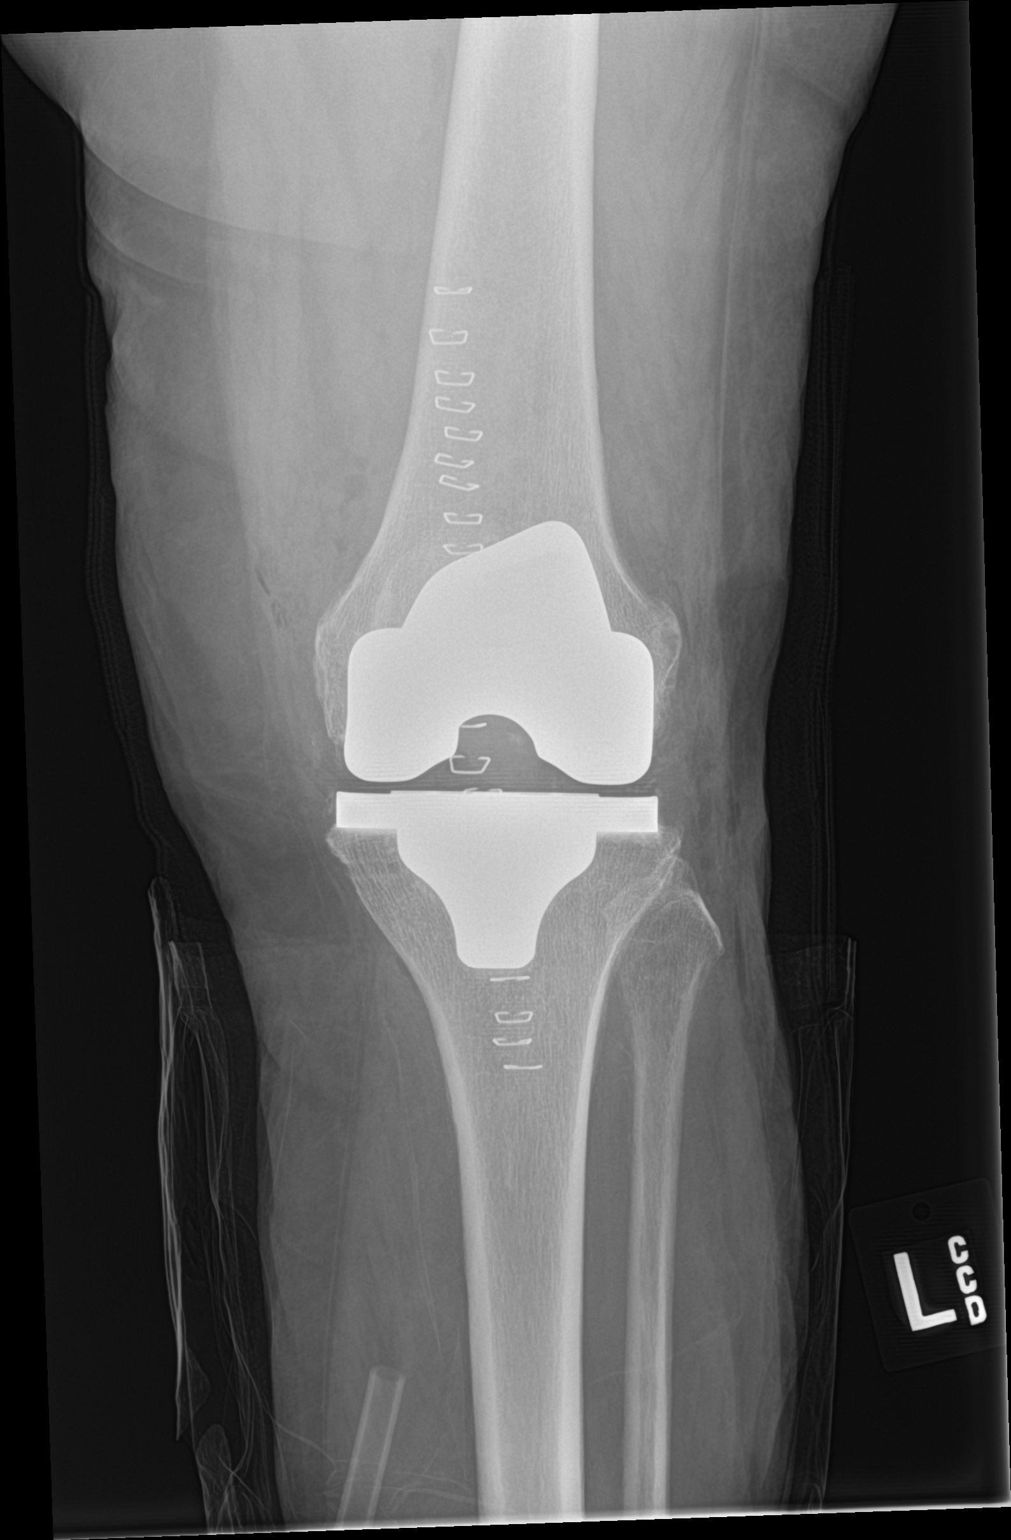

[2 of 2 positions shown; findings below may reference images not displayed]

FINDINGS: The left femoral and tibial components are well situated. Expected
postoperative changes are noted in the soft tissues anteriorly.
IMPRESSION: Status post left total knee arthroplasty.
